# Patient Record
Sex: Female | Born: 1975 | Marital: Single | Smoking: Never smoker
Health system: Southern US, Community
[De-identification: ages and names within clinical notes are randomized; demographics above are authoritative.]

## PROBLEM LIST (undated history)

## (undated) DIAGNOSIS — D849 Immunodeficiency, unspecified: Secondary | ICD-10-CM

## (undated) DIAGNOSIS — I10 Essential (primary) hypertension: Secondary | ICD-10-CM

## (undated) DIAGNOSIS — R7303 Prediabetes: Secondary | ICD-10-CM

## (undated) DIAGNOSIS — F32A Depression, unspecified: Secondary | ICD-10-CM

## (undated) DIAGNOSIS — D509 Iron deficiency anemia, unspecified: Secondary | ICD-10-CM

## (undated) DIAGNOSIS — F419 Anxiety disorder, unspecified: Secondary | ICD-10-CM

## (undated) HISTORY — DX: Anxiety disorder, unspecified: F41.9

## (undated) HISTORY — DX: Prediabetes: R73.03

## (undated) HISTORY — DX: Iron deficiency anemia, unspecified: D50.9

## (undated) HISTORY — DX: Depression, unspecified: F32.A

---

## 2017-10-03 ENCOUNTER — Ambulatory Visit: Payer: Self-pay | Admitting: Podiatry

## 2017-10-17 ENCOUNTER — Ambulatory Visit: Payer: Medicaid Other | Admitting: Podiatry

## 2017-11-07 ENCOUNTER — Encounter: Payer: Self-pay | Admitting: Podiatry

## 2017-11-07 ENCOUNTER — Ambulatory Visit: Payer: Medicaid Other | Admitting: Podiatry

## 2017-11-07 DIAGNOSIS — L309 Dermatitis, unspecified: Secondary | ICD-10-CM

## 2017-11-07 DIAGNOSIS — L600 Ingrowing nail: Secondary | ICD-10-CM | POA: Diagnosis not present

## 2017-11-07 MED ORDER — HYDROCODONE-ACETAMINOPHEN 5-325 MG PO TABS: 1.0000 | ORAL_TABLET | Freq: Four times a day (QID) | ORAL | 0 refills | Status: DC | PRN

## 2017-11-07 NOTE — Patient Instructions (Addendum)

## 2017-11-07 NOTE — Progress Notes (Signed)
   Subjective:    Patient ID: Tiffany Rose, female    DOB: [birth year], 42 y.o.   MRN: [id number]  HPI    Review of Systems  All other systems reviewed and are negative.      Objective:   Physical Exam        Assessment & Plan:

## 2017-11-08 NOTE — Progress Notes (Signed)
Subjective:   Patient ID: Tiffany Rose, female   DOB: 42 y.o.   MRN: [id number]   HPI Patient presents stating she has dry skin and she has significant ingrown toenails of her big toes both feet that it is very sore and she is tried to trim them and soak it without relief and is making increasingly hard to wear shoe gear.  Patient does not smoke currently and likes to be active   Review of Systems  All other systems reviewed and are negative.       Objective:  Physical Exam  Constitutional: She appears well-developed and well-nourished.  Cardiovascular: Intact distal pulses.  Pulmonary/Chest: Effort normal.  Musculoskeletal: Normal range of motion.  Neurological: She is alert.  Skin: Skin is warm.  Nursing note and vitals reviewed.   Neurovascular status intact muscle strength was adequate range of motion within normal limits with patient found to have dry skin and heels bilateral with incurvated hallux nails bilateral medial border that are very painful when pressed with no drainage or redness noted.  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  She wants this done and I explained procedures and risk and she signed consent form understanding there is no guarantee as far as healing and complications as listed.  I infiltrated each hallux 60 mg like Marcaine mixture sterile prep applied to the toes and using sterile instrumentation I removed the medial border of the hallux bilateral exposed matrix and applied phenol 3 applications 30 seconds followed by alcohol lavage sterile dressing.  Gave instructions on soaks and reappoint and instructed what to do for dry skin and  reappoint to recheck

## 2018-07-31 ENCOUNTER — Other Ambulatory Visit: Payer: Self-pay | Admitting: Physician Assistant

## 2018-07-31 DIAGNOSIS — R1084 Generalized abdominal pain: Secondary | ICD-10-CM

## 2018-07-31 DIAGNOSIS — R110 Nausea: Secondary | ICD-10-CM

## 2018-08-06 ENCOUNTER — Ambulatory Visit
Admission: RE | Admit: 2018-08-06 | Discharge: 2018-08-06 | Disposition: A | Discharge status: Home / Self Care | Payer: Medicaid Other | Source: Ambulatory Visit | Attending: Physician Assistant | Admitting: Physician Assistant

## 2018-08-06 DIAGNOSIS — R1084 Generalized abdominal pain: Secondary | ICD-10-CM

## 2018-08-06 DIAGNOSIS — R110 Nausea: Secondary | ICD-10-CM

## 2018-08-11 ENCOUNTER — Emergency Department (HOSPITAL_COMMUNITY): Payer: Medicaid Other

## 2018-08-11 ENCOUNTER — Emergency Department (HOSPITAL_COMMUNITY)
Admission: EM | Admit: 2018-08-11 | Discharge: 2018-08-11 | Disposition: A | Discharge status: Home / Self Care | Payer: Medicaid Other | Attending: Emergency Medicine | Admitting: Emergency Medicine

## 2018-08-11 ENCOUNTER — Other Ambulatory Visit: Payer: Self-pay

## 2018-08-11 ENCOUNTER — Encounter (HOSPITAL_COMMUNITY): Payer: Self-pay | Admitting: Emergency Medicine

## 2018-08-11 DIAGNOSIS — Z79899 Other long term (current) drug therapy: Secondary | ICD-10-CM | POA: Insufficient documentation

## 2018-08-11 DIAGNOSIS — H4710 Unspecified papilledema: Secondary | ICD-10-CM | POA: Insufficient documentation

## 2018-08-11 DIAGNOSIS — I10 Essential (primary) hypertension: Secondary | ICD-10-CM | POA: Diagnosis not present

## 2018-08-11 DIAGNOSIS — H538 Other visual disturbances: Secondary | ICD-10-CM | POA: Diagnosis present

## 2018-08-11 HISTORY — DX: Essential (primary) hypertension: I10

## 2018-08-11 HISTORY — DX: Immunodeficiency, unspecified: D84.9

## 2018-08-11 LAB — CBC WITH DIFFERENTIAL/PLATELET
Abs Immature Granulocytes: 0.01 10*3/uL (ref 0.00–0.07)
Basophils Absolute: 0 10*3/uL (ref 0.0–0.1)
Basophils Relative: 1 %
EOS ABS: 0.1 10*3/uL (ref 0.0–0.5)
EOS PCT: 1 %
HEMATOCRIT: 35.7 % — AB (ref 36.0–46.0)
Hemoglobin: 10.8 g/dL — ABNORMAL LOW (ref 12.0–15.0)
Immature Granulocytes: 0 %
LYMPHS ABS: 1.7 10*3/uL (ref 0.7–4.0)
Lymphocytes Relative: 38 %
MCH: 21.2 pg — AB (ref 26.0–34.0)
MCHC: 30.3 g/dL (ref 30.0–36.0)
MCV: 70 fL — AB (ref 80.0–100.0)
MONO ABS: 0.2 10*3/uL (ref 0.1–1.0)
MONOS PCT: 5 %
Neutro Abs: 2.4 10*3/uL (ref 1.7–7.7)
Neutrophils Relative %: 55 %
Platelets: 411 10*3/uL — ABNORMAL HIGH (ref 150–400)
RBC: 5.1 MIL/uL (ref 3.87–5.11)
RDW: 16.2 % — AB (ref 11.5–15.5)
WBC: 4.4 10*3/uL (ref 4.0–10.5)
nRBC: 0 % (ref 0.0–0.2)

## 2018-08-11 LAB — BASIC METABOLIC PANEL
Anion gap: 15 (ref 5–15)
BUN: 15 mg/dL (ref 6–20)
CO2: 24 mmol/L (ref 22–32)
CREATININE: 0.92 mg/dL (ref 0.44–1.00)
Calcium: 10 mg/dL (ref 8.9–10.3)
Chloride: 98 mmol/L (ref 98–111)
GFR calc Af Amer: >60 mL/min (ref >60)
GFR calc non Af Amer: >60 mL/min (ref >60)
GLUCOSE: 102 mg/dL — AB (ref 70–99)
Potassium: 3.6 mmol/L (ref 3.5–5.1)
Sodium: 137 mmol/L (ref 135–145)

## 2018-08-11 MED ORDER — LORAZEPAM 1 MG PO TABS
1.0000 mg | ORAL_TABLET | Freq: Once | ORAL | Status: AC
  Administered 2018-08-11: 1 mg via ORAL
  Filled 2018-08-11: qty 1

## 2018-08-11 NOTE — Discharge Instructions (Signed)
Please return for any problem.  Follow-up with your regular care provider as instructed.  Your MRI does not show evidence of significant intracranial pathology such as a brain tumor.  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  This requires close follow-up with neurology.  You will need further work-up as an outpatient.  If you develop worsening headache or significant visual change please return to the ED immediately.

## 2018-08-11 NOTE — ED Triage Notes (Addendum)
Pt reports going to the ophthalmologist for  blurred vision in the Left eye. 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 Pt tearful at triage. She reports waiting to come due to lack of child care. Swarm in progress.

## 2018-08-11 NOTE — ED Notes (Signed)
Unable to get blood from IV. Phlebotomist notified.

## 2018-08-11 NOTE — ED Notes (Signed)
Patient verbalizes understanding of discharge instructions. Opportunity for questioning and answers were provided. 

## 2018-08-11 NOTE — ED Notes (Signed)
Pt back from MRI 

## 2018-08-11 NOTE — ED Notes (Signed)
Social work contacted for assistance with the patients children while she is in a long exam.

## 2018-08-11 NOTE — ED Provider Notes (Signed)
[facility] EMERGENCY DEPARTMENT Provider Note   CSN: [id number] Arrival date & time: 08/11/18  1059     History   Chief Complaint Chief Complaint  Patient presents with  . Blurred Vision    HPI Berea Barsanti is a 42 y.o. female.  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  Patient was seen by her optometrist earlier this week.  She reports that at that time she was diagnosed with bilateral papilledema.  She was told to come to the ED for MRI / MRV imaging.  Her exam was performed on Thursday.  She presents today with her children.  She was unable to find childcare prior to today in order to come to the ED sooner.  She denies headache.  She denies significant change in her vision over the last 1 to 2 weeks.  She reports ongoing mildly blurry vision that is been a problem for the last several months.  The history is provided by the patient and medical records.  Illness  This is a new problem. The current episode started more than 1 week ago. The problem occurs constantly. The problem has not changed since onset.Pertinent negatives include no chest pain, no abdominal pain, no headaches and no shortness of breath. Nothing aggravates the symptoms. Nothing relieves the symptoms.    Past Medical History:  Diagnosis Date  . Hypertension   . Immune deficiency disorder (HCC)     There are no active problems to display for this patient.   History reviewed. No pertinent surgical history.   OB History   No obstetric history on file.      Home Medications    Prior to Admission medications   Medication Sig Start Date End Date Taking? Authorizing Provider  HYDROcodone-acetaminophen (NORCO/VICODIN) 5-325 MG tablet Take 1 tablet by mouth every 6 (six) hours as needed for moderate pain. 11/07/17   Lenn Sink, DPM    Family History No family history on file.  Social History Social History    Tobacco Use  . Smoking status: Never Smoker  . Smokeless tobacco: Never Used  Substance Use Topics  . Alcohol use: Yes  . Drug use: Yes    Types: Marijuana     Allergies   Patient has no known allergies.   Review of Systems Review of Systems  Respiratory: Negative for shortness of breath.   Cardiovascular: Negative for chest pain.  Gastrointestinal: Negative for abdominal pain.  Neurological: Negative for headaches.  All other systems reviewed and are negative.    Physical Exam Updated Vital Signs BP 123/85   Pulse (!) 101   Temp 98.6 F (37 C) (Oral)   Resp 20   Ht 5\' 4"  (1.626 m)   Wt 131.5 kg   LMP 07/25/2018 (Exact Date)   SpO2 99%   BMI 49.78 kg/m   Physical Exam Vitals signs and nursing note reviewed.  Constitutional:      General: She is not in acute distress.    Appearance: She is well-developed.  HENT:     Head: Normocephalic and atraumatic.  Eyes:     Conjunctiva/sclera: Conjunctivae normal.     Pupils: Pupils are equal, round, and reactive to light.  Neck:     Musculoskeletal: Normal range of motion and neck supple.  Cardiovascular:     Rate and Rhythm: Normal rate and regular rhythm.     Heart sounds: Normal heart sounds.  Pulmonary:  Effort: Pulmonary effort is normal. No respiratory distress.     Breath sounds: Normal breath sounds.  Abdominal:     General: There is no distension.     Palpations: Abdomen is soft.     Tenderness: There is no abdominal tenderness.  Musculoskeletal: Normal range of motion.        General: No deformity.  Skin:    General: Skin is warm and dry.  Neurological:     General: No focal deficit present.     Mental Status: She is alert and oriented to person, place, and time. Mental status is at baseline.     Cranial Nerves: No cranial nerve deficit.     Sensory: No sensory deficit.     Motor: No weakness.     Coordination: Coordination normal.     Gait: Gait normal.     Deep Tendon Reflexes: Reflexes  normal.      ED Treatments / Results  Labs (all labs ordered are listed, but only abnormal results are displayed) Labs Reviewed  BASIC METABOLIC PANEL - Abnormal; Notable for the following components:      Result Value   Glucose, Bld 102 (*)    All other components within normal limits  CBC WITH DIFFERENTIAL/PLATELET - Abnormal; Notable for the following components:   Hemoglobin 10.8 (*)    HCT 35.7 (*)    MCV 70.0 (*)    MCH 21.2 (*)    RDW 16.2 (*)    Platelets 411 (*)    All other components within normal limits    EKG None  Radiology Mr Brain Wo Contrast  Result Date: 08/11/2018 CLINICAL DATA:  Papilledema. Suspected idiopathic intracranial hypertension. EXAM: MRI HEAD AND ORBITS WITHOUT CONTRAST TECHNIQUE: Multiplanar, multiecho pulse sequences of the brain and surrounding structures were obtained without intravenous contrast. Multiplanar, multiecho pulse sequences of the orbits and surrounding structures were obtained including fat saturation techniques, without intravenous contrast administration. COMPARISON:  MRV reported separately. FINDINGS: The patient was unable to remain motionless for the exam. Small or subtle lesions could be overlooked. MRI HEAD FINDINGS Brain: No evidence for acute infarction, hemorrhage, mass lesion, hydrocephalus, or extra-axial fluid. Normal cerebral volume. Prominent perivascular spaces, but no definite white matter disease. Mild tonsillar ectopia. Marked empty sella, with expansion. Vascular: Flow voids are maintained. Skull and upper cervical spine: Normal marrow signal Other: None MRI ORBITS FINDINGS Orbits: No traumatic or inflammatory finding. There is flattening of the optic nerve insertion into the globes, consistent with the clinical impression of papilledema. There is prominence of the perioptic CSF, representing dilated optic nerve sheathes. The orbital fat, extraocular muscles, vascular structures, and lacrimal glands are normal.  Visualized sinuses: Clear. Soft tissues: Increased body habitus. Limited intracranial: Reported separately. IMPRESSION: Motion degraded exam.  Small or subtle lesions could be overlooked. No acute intracranial or orbital abnormality. 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 Electronically Signed   By: Elsie StainJohn T Curnes M.D.   On: 08/11/2018 14:18   Mr Mrv Head Wo Cm  Result Date: 08/11/2018 CLINICAL DATA:  Papilledema. Suspected dural venous sinus stenosis or thrombosis. EXAM: MR VENOGRAM of the HEAD WITHOUT CONTRAST TECHNIQUE: Angiographic images of the intracranial venous structures were obtained using MRV technique without intravenous contrast. COMPARISON:  None. FINDINGS: The superior sagittal sinus is patent. The internal cerebral veins are patent. The straight sinus is patent. There is no visible cortical venous thrombosis.  Both transverse sinuses are patent, but there is narrowing at the junction of the transverse and sigmoid sinuses, concerning for venous sinus stenosis. Sigmoid sinuses are widely patent. Both internal cerebral veins are widely patent. 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 In the appropriate setting, findings could be contributory to idiopathic intracranial hypertension. Electronically Signed   By: Elsie Stain M.D.   On: 08/11/2018 14:11   Mr Birdie Hopes IO Contrast  Result Date: 08/11/2018 CLINICAL DATA:  Papilledema. Suspected idiopathic intracranial hypertension. EXAM: MRI HEAD AND ORBITS WITHOUT CONTRAST TECHNIQUE: Multiplanar, multiecho pulse sequences of the brain and surrounding structures were obtained without intravenous contrast. Multiplanar, multiecho pulse sequences of the orbits and surrounding structures were obtained including fat saturation techniques,  without intravenous contrast administration. COMPARISON:  MRV reported separately. FINDINGS: The patient was unable to remain motionless for the exam. Small or subtle lesions could be overlooked. MRI HEAD FINDINGS Brain: No evidence for acute infarction, hemorrhage, mass lesion, hydrocephalus, or extra-axial fluid. Normal cerebral volume. Prominent perivascular spaces, but no definite white matter disease. Mild tonsillar ectopia. Marked empty sella, with expansion. Vascular: Flow voids are maintained. Skull and upper cervical spine: Normal marrow signal Other: None MRI ORBITS FINDINGS Orbits: No traumatic or inflammatory finding. There is flattening of the optic nerve insertion into the globes, consistent with the clinical impression of papilledema. There is prominence of the perioptic CSF, representing dilated optic nerve sheathes. The orbital fat, extraocular muscles, vascular structures, and lacrimal glands are normal. Visualized sinuses: Clear. Soft tissues: Increased body habitus. Limited intracranial: Reported separately. IMPRESSION: Motion degraded exam.  Small or subtle lesions could be overlooked. No acute intracranial or orbital abnormality. 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 Electronically Signed   By: Elsie Stain M.D.   On: 08/11/2018 14:18    Procedures Procedures (including critical care time)  Medications Ordered in ED Medications  LORazepam (ATIVAN) tablet 1 mg (1 mg Oral Given 08/11/18 1221)     Initial Impression / Assessment and Plan / ED Course  I have reviewed the triage vital signs and the nursing notes.  Pertinent labs & imaging results that were available during my care of the patient were reviewed by me and considered in my medical decision making (see chart for details).     MDM  Screen complete  Patient is  presenting for evaluation of bilateral papilledema found on work-up by her optometrist.  Patient was instructed to present to the ED for MRI imaging 3 days prior.  She presents today with her children.  MRI / MRV demonstrates findings consistent with likely idiopathic intracranial hypertension.  Patient is aware of these findings.  She understands the need for close follow-up with neurology.  Case discussed with in-house neuro hospitalist Dr. Otelia Limes.  He agrees with plan of care.  She understands that she will need to see neurology and likely undergo an LP.  She understands she will need to have childcare for these procedures.  Strict return precautions given and understood.  Importance of close follow-up is repeatedly stressed.  Final Clinical Impressions(s) / ED Diagnoses   Final diagnoses:  Blurred vision    ED Discharge Orders    None       Wynetta Fines, MD 08/11/18 1547

## 2018-08-11 NOTE — ED Notes (Signed)
Patient transported to MRI 

## 2018-09-02 ENCOUNTER — Other Ambulatory Visit: Payer: Self-pay | Admitting: Radiology

## 2019-05-29 IMAGING — US US ABDOMEN COMPLETE
1 series · 14 of 25 positions shown · non-contrast
Comparison: None.

CLINICAL DATA: Abdominal pain with nausea

EXAM:
ABDOMEN ULTRASOUND COMPLETE

[Series 1: us abdomen complete · 0.15mm/px · 14 of 76 slices shown]
[im 1/76]
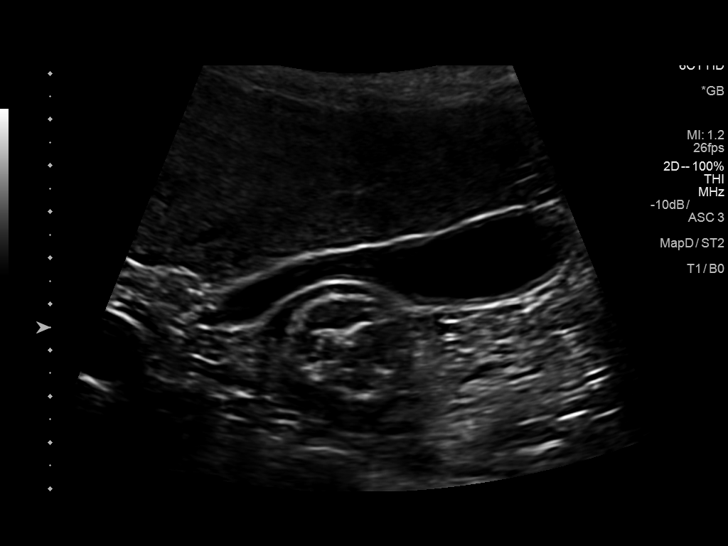
[im 7/76]
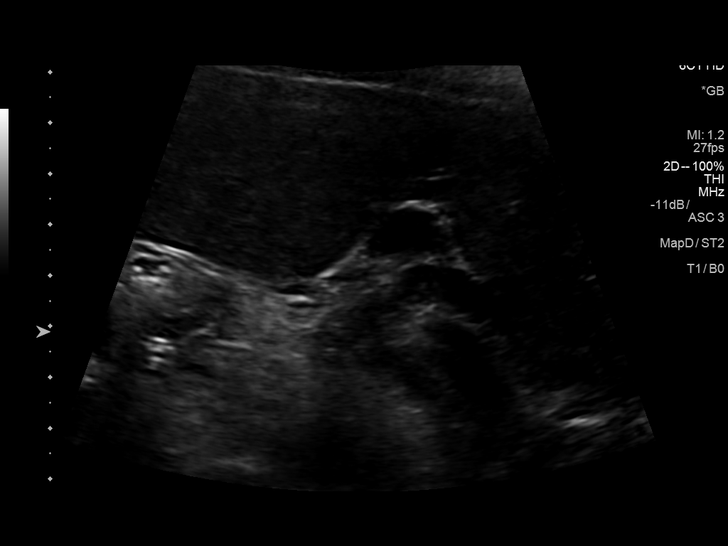
[im 13/76]
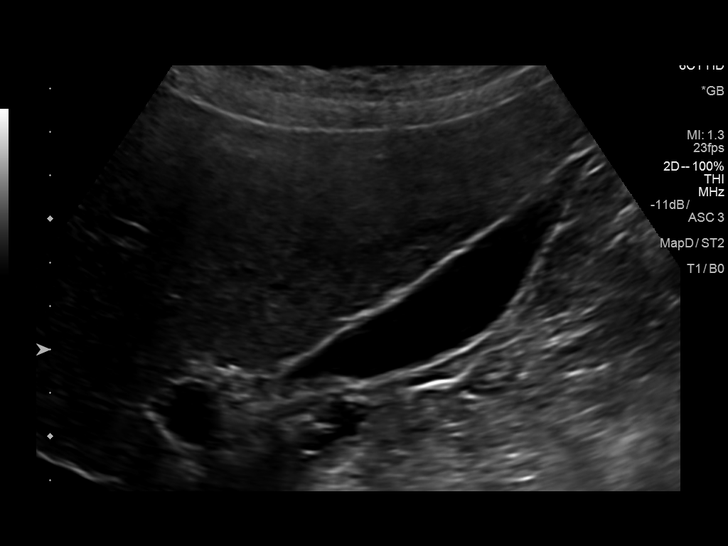
[im 19/76]
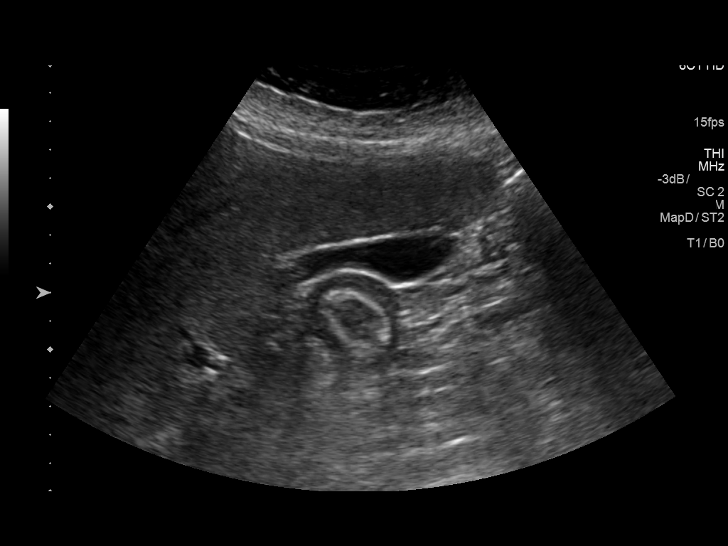
[im 26/76]
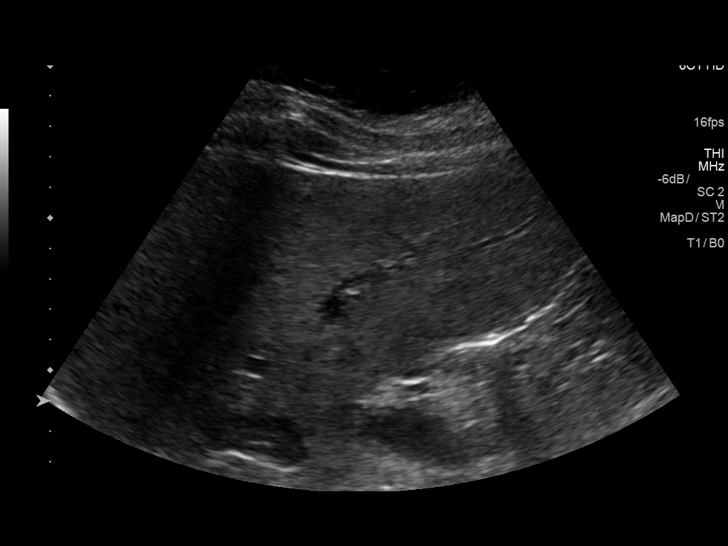
[im 29/76]
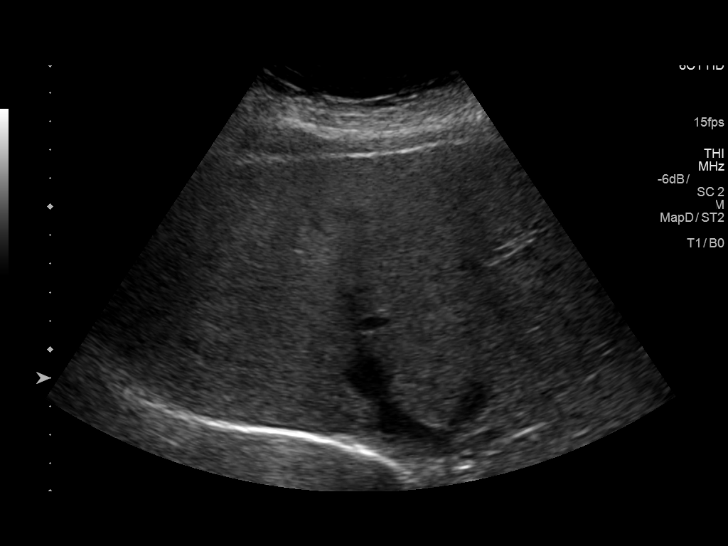
[im 35/76]
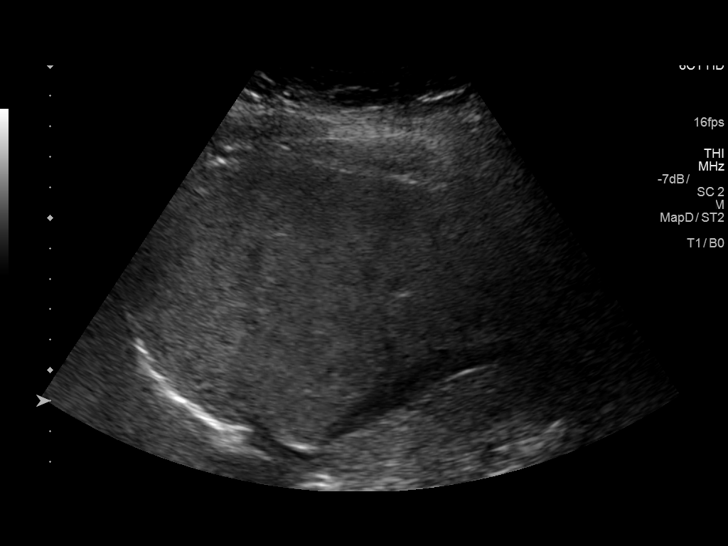
[im 41/76]
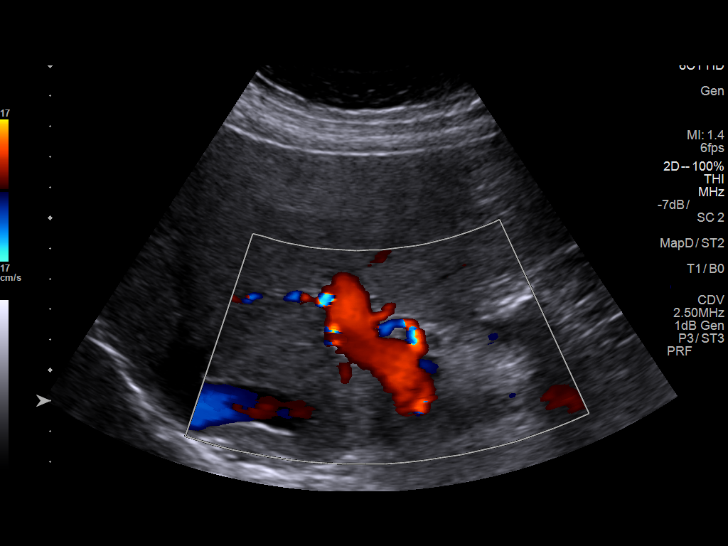
[im 47/76]
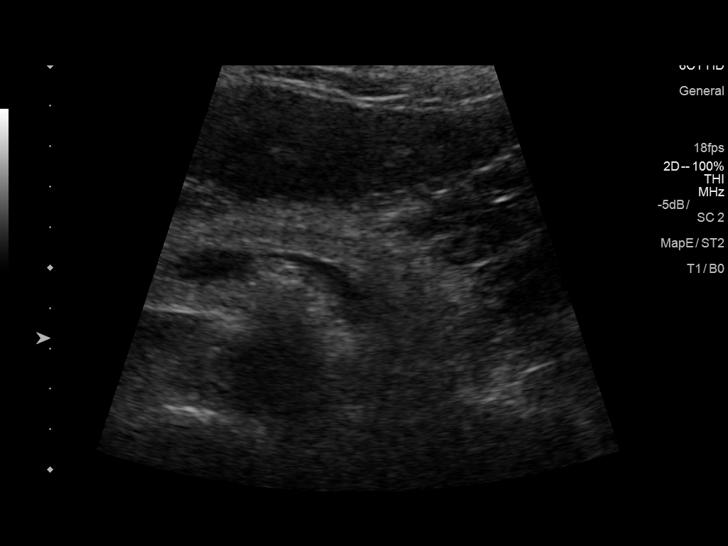
[im 51/76]
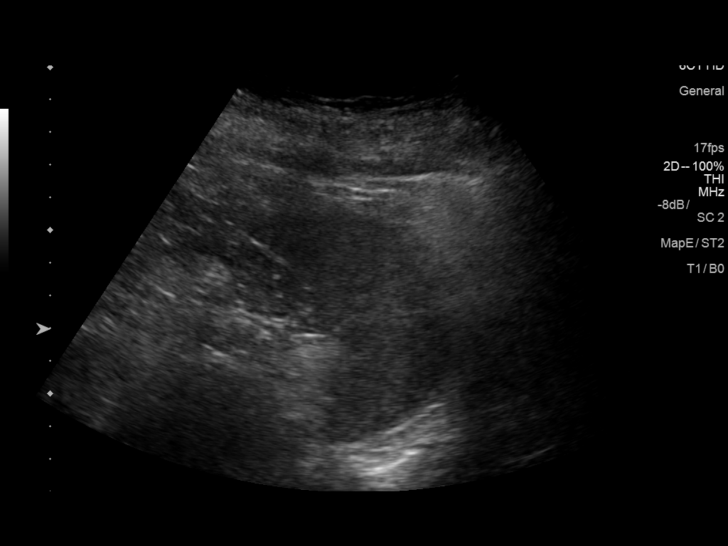
[im 57/76]
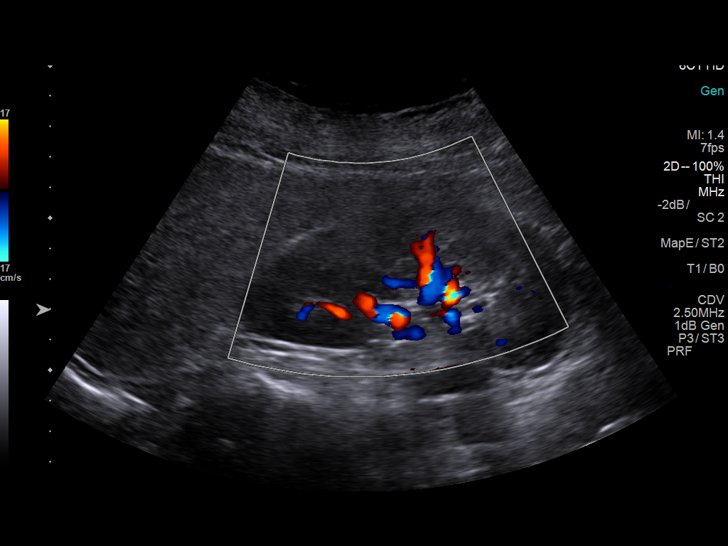
[im 63/76]
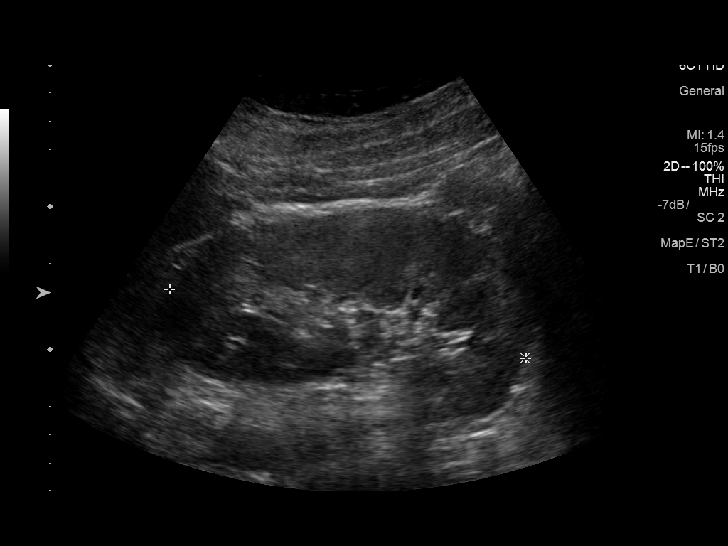
[im 69/76]
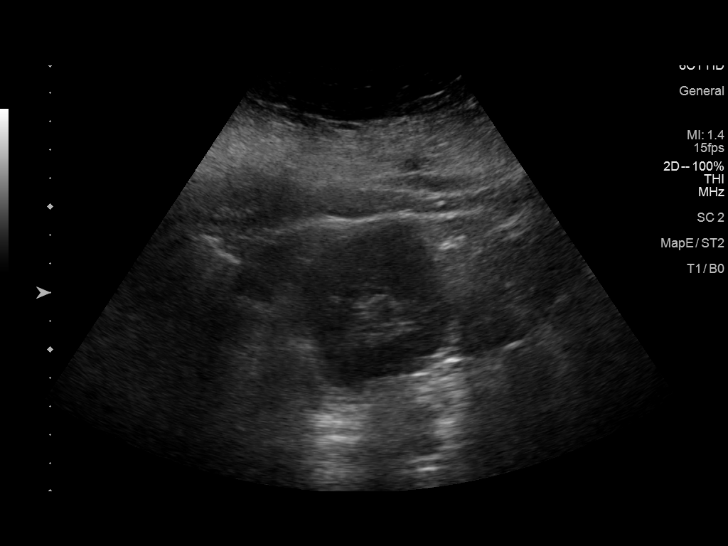
[im 76/76]
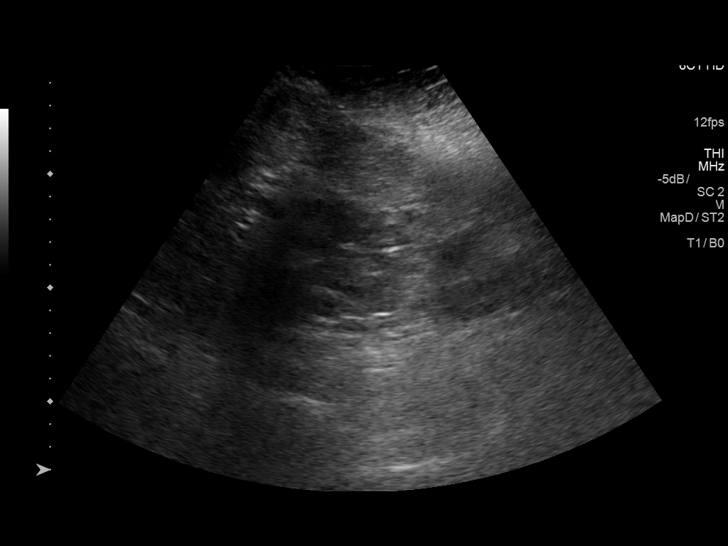

[14 of 25 positions shown; findings below may reference images not displayed]

FINDINGS: Gallbladder: No gallstones or wall thickening visualized. No
sonographic Murphy sign noted by sonographer.

Common bile duct: Diameter: 2.3 mm

Liver: Increased hepatic echogenicity without focal hepatic
abnormality. Heterogeneous echotexture. Portal vein is patent on
color Doppler imaging with normal direction of blood flow towards
the liver.

IVC: Poorly visualized due to bowel gas.

Pancreas: Visualized portion unremarkable.

Spleen: Size and appearance within normal limits.

Right Kidney: Length: 13.2 cm. Echogenicity within normal limits. No
mass or hydronephrosis visualized.

Left Kidney: Length: 13.3 cm. Echogenicity within normal limits. No
mass or hydronephrosis visualized.

Abdominal aorta: Obscured by bowel gas.

Other findings: None.
IMPRESSION: 1. Negative for gallstones or biliary dilatation
2. Increased hepatic echogenicity suggesting steatosis

## 2020-08-16 ENCOUNTER — Other Ambulatory Visit: Payer: Self-pay | Admitting: Student

## 2020-08-16 DIAGNOSIS — N611 Abscess of the breast and nipple: Secondary | ICD-10-CM

## 2020-12-28 ENCOUNTER — Other Ambulatory Visit: Payer: Self-pay

## 2020-12-28 ENCOUNTER — Ambulatory Visit: Payer: Medicaid Other | Admitting: Cardiology

## 2020-12-28 ENCOUNTER — Encounter: Payer: Self-pay | Admitting: Cardiology

## 2020-12-28 VITALS — BP 123/85 | HR 89 | Temp 98.0°F | Resp 16 | Ht 63.0 in | Wt 290.0 lb

## 2020-12-28 DIAGNOSIS — Z8249 Family history of ischemic heart disease and other diseases of the circulatory system: Secondary | ICD-10-CM

## 2020-12-28 DIAGNOSIS — R002 Palpitations: Secondary | ICD-10-CM

## 2020-12-28 DIAGNOSIS — I10 Essential (primary) hypertension: Secondary | ICD-10-CM

## 2020-12-28 DIAGNOSIS — Z87891 Personal history of nicotine dependence: Secondary | ICD-10-CM

## 2020-12-28 DIAGNOSIS — R7303 Prediabetes: Secondary | ICD-10-CM

## 2020-12-28 DIAGNOSIS — R0602 Shortness of breath: Secondary | ICD-10-CM

## 2020-12-28 DIAGNOSIS — F1290 Cannabis use, unspecified, uncomplicated: Secondary | ICD-10-CM

## 2020-12-28 DIAGNOSIS — R072 Precordial pain: Secondary | ICD-10-CM

## 2020-12-28 DIAGNOSIS — Z6841 Body Mass Index (BMI) 40.0 and over, adult: Secondary | ICD-10-CM

## 2020-12-28 NOTE — Progress Notes (Signed)
Date:  12/28/2020   ID:  Tiffany Rose, DOB 1975-10-11, MRN [id number]  PCP:  Trey Sailors, PA  Cardiologist:  Rex Kras, DO, [facility] (established care 12/28/2020)  REASON FOR CONSULT: Palpitations  REQUESTING PHYSICIAN:  Trey Sailors, PA [address] [place],  [place] [address]  Chief Complaint  Patient presents with  . Palpitations  . New Patient (Initial Visit)  . Chest Pain    HPI  Tiffany Rose is a 45 y.o. female who presents to the office with a chief complaint of "palpitations and chest pain." Patient's past medical history and cardiovascular risk factors include: former smoker, benign essential hypertension, hyperlipidemia, vitamin D deficiency, anxiety/depression, anemia, obesity due to excess calories, family history of congestive heart failure and premature CAD.  She is referred to the office at the request of Trey Sailors, [place] for evaluation of palpitations.  Palpitations: Patient states that she has been experiencing palpitations since 2020.  However, recently has noticed increase in intensity, frequency, and duration over the last 5 weeks.  Patient states that recently she was in the car in the passenger seat when she had sudden onset of palpitations lasting for about 90 seconds and self-limited.  Symptoms of palpitations occur at least once or twice a week, short-lived, less than 1 minute in duration, self-limited, no worsening factors, improves with resting and deep breathing exercises.  Patient denies any near-syncope or syncopal events.  She consumes 1 cup of 12 ounce coffee on a daily basis.  2 cans of soda per day, and a glass of wine 4 days of the week.  Patient is currently being treated for iron deficiency anemia as well.  She refuses the use of tobacco, thyroid disease, alcohol consumption, weight loss supplements, stimulant drugs.  Chest pain: Patient stated that the symptoms started first back in 2020 and would like to have them  addressed as well.  The symptoms are not increasing in intensity, frequency, duration.  Patient states that his chest pain is substernally located, intensity 7 out of 10, squeezing-like sensation and at times pressure.  Patient states that " feels like an elephant is on the chest."  Duration less than a minute.  Not brought on by effort related activities and does not resolve with resting.  The symptoms are usually self-limited.  Patient has started working out at the gym about a week ago and tries to walk on a treadmill about 4 miles an hour for about 60 minutes.  She has not noticed any significant change in physical endurance.  Patient states that she had a sleep study in the past which was negative for sleep apnea.  Family history of congestive heart failure: Mom (dx at age of 1 and died at age of 41), Brother (dx in his mid 77s and died at age of 56 - patient describes him "wearning a backpack" ?LVAD and strong hx of polysubstance use), Niece (had CAD and MI/PCI in mid 36s and died at age of 42). Two brothers are alive one at age 64 and another at age 2.   No family history of sudden cardiac death.   FUNCTIONAL STATUS: Started going back to the gym about a week ago walks on a treadmill at 4 miles an hour for about 60 minutes each.  ALLERGIES: No Known Allergies  MEDICATION LIST PRIOR TO VISIT: Current Meds  Medication Sig  . gabapentin (NEURONTIN) 300 MG capsule Take 1 capsule by mouth 2 (two) times daily.  . hydrochlorothiazide (HYDRODIURIL) 25  MG tablet Take 1 tablet by mouth daily at 6 (six) AM.  . LORazepam (ATIVAN) 0.5 MG tablet Take 0.5 mg by mouth 2 (two) times daily as needed.  . meloxicam (MOBIC) 15 MG tablet Take 1 tablet by mouth daily.  . propranolol (INDERAL) 10 MG tablet Take 10 mg by mouth 2 (two) times daily as needed.     PAST MEDICAL HISTORY: Past Medical History:  Diagnosis Date  . Anxiety   . Depression   . Hypertension   . Immune deficiency disorder ([place])    . Iron (Fe) deficiency anemia   . Prediabetes     PAST SURGICAL HISTORY: History reviewed. No pertinent surgical history.  FAMILY HISTORY: The patient family history includes Congestive Heart Failure (age of onset: 25) in her brother; Heart disease in her brother; Hypertension in her mother.  SOCIAL HISTORY:  The patient  reports that she has never smoked. She has never used smokeless tobacco. She reports current alcohol use. She reports current drug use. Drug: Marijuana.  REVIEW OF SYSTEMS: Review of Systems  Constitutional: Negative for chills and fever.  HENT: Negative for hoarse voice and nosebleeds.   Eyes: Negative for discharge, double vision and pain.  Cardiovascular: Positive for chest pain, dyspnea on exertion and palpitations. Negative for claudication, leg swelling, near-syncope, orthopnea, paroxysmal nocturnal dyspnea and syncope.  Respiratory: Negative for hemoptysis and shortness of breath.   Musculoskeletal: Negative for muscle cramps and myalgias.  Gastrointestinal: Negative for abdominal pain, constipation, diarrhea, hematemesis, hematochezia, melena, nausea and vomiting.  Neurological: Negative for dizziness and light-headedness.    PHYSICAL EXAM: Vitals with BMI 12/28/2020 08/11/2018 08/11/2018  Height $Remov'5\' 3"'FsCLAJ$  - -  Weight 290 lbs - -  BMI 74.94 - -  Systolic 496 759 163  Diastolic 85 84 94  Pulse 89 94 100    CONSTITUTIONAL: Well-developed and well-nourished. No acute distress.  SKIN: Skin is warm and dry. No rash noted. No cyanosis. No pallor. No jaundice HEAD: Normocephalic and atraumatic.  EYES: No scleral icterus MOUTH/THROAT: Moist oral membranes.  NECK: No JVD present. No thyromegaly noted. No carotid bruits  LYMPHATIC: No visible cervical adenopathy.  CHEST Normal respiratory effort. No intercostal retractions  LUNGS: Clear to auscultation bilaterally. No stridor. No wheezes. No rales.  CARDIOVASCULAR: Regular rate and rhythm, positive S1-S2, no  murmurs rubs or gallops appreciated. ABDOMINAL: Obese, soft, nontender, distended, positive bowel sounds in all 4 quadrants. No apparent ascites.  EXTREMITIES: No peripheral edema  HEMATOLOGIC: No significant bruising NEUROLOGIC: Oriented to person, place, and time. Nonfocal. Normal muscle tone.  PSYCHIATRIC: Normal mood and affect. Normal behavior. Cooperative  CARDIAC DATABASE: EKG: 12/28/2020: Normal sinus rhythm, 90 bpm, normal axis, without underlying ischemia or injury pattern.   Echocardiogram: No results found for this or any previous visit from the past 1095 days.   Stress Testing: No results found for this or any previous visit from the past 1095 days.  Heart Catheterization: None  LABORATORY DATA: CBC Latest Ref Rng & Units 08/11/2018  WBC 4.0 - 10.5 K/uL 4.4  Hemoglobin 12.0 - 15.0 g/dL 10.8(L)  Hematocrit 36.0 - 46.0 % 35.7(L)  Platelets 150 - 400 K/uL 411(H)    CMP Latest Ref Rng & Units 08/11/2018  Glucose 70 - 99 mg/dL 102(H)  BUN 6 - 20 mg/dL 15  Creatinine 0.44 - 1.00 mg/dL 0.92  Sodium 135 - 145 mmol/L 137  Potassium 3.5 - 5.1 mmol/L 3.6  Chloride 98 - 111 mmol/L 98  CO2 22 - 32  mmol/L 24  Calcium 8.9 - 10.3 mg/dL 10.0    Lipid Panel  No results found for: CHOL, TRIG, HDL, CHOLHDL, VLDL, LDLCALC, LDLDIRECT, LABVLDL  No components found for: NTPROBNP No results for input(s): PROBNP in the last 8760 hours. No results for input(s): TSH in the last 8760 hours.  BMP No results for input(s): NA, K, CL, CO2, GLUCOSE, BUN, CREATININE, CALCIUM, GFRNONAA, GFRAA in the last 8760 hours.  HEMOGLOBIN A1C No results found for: HGBA1C, MPG  External Labs: Collected: 11/24/2020 provided by primary care Creatinine 0.17 mg/dL. eGFR: >60 mL/min per 1.73 m Sodium 138, potassium 4.3, chloride 103, bicarb 26 Hemoglobin A1c: 6.3 TSH: 1.26   IMPRESSION:    ICD-10-CM   1. Palpitations  R00.2 EKG 12-Lead    LONG TERM MONITOR (3-14 DAYS)  2. Precordial pain   R07.2 PCV ECHOCARDIOGRAM COMPLETE    PCV CARDIAC STRESS TEST    CT CARDIAC SCORING (SELF PAY ONLY)    SARS-COV-2 RNA,(COVID-19) QUAL NAAT  3. Shortness of breath  R06.02   4. Benign hypertension  I10   5. Prediabetes  R73.03   6. Class 3 severe obesity due to excess calories without serious comorbidity with body mass index (BMI) of 50.0 to 59.9 in adult (HCC)  E66.01    Z68.43   7. Family history of CHF (congestive heart failure)  Z82.49   8. Family history of premature CAD  Z82.49 CT CARDIAC SCORING (SELF PAY ONLY)    Pregnancy, urine  9. Former smoker  Z87.891   33. Marijuana use  F12.90      RECOMMENDATIONS: Tiffany Rose is a 45 y.o. female whose past medical history and cardiac risk factors include: former smoker, benign essential hypertension, hyperlipidemia, vitamin D deficiency, anxiety/depression, anemia, obesity due to excess calories, family history of congestive heart failure and premature CAD.  Palpitations:  No identifiable reversible causes.  Outside labs independently reviewed.  Patient does have history of iron deficiency anemia currently being treated by her PCP.  TSH within normal limits.  Patient was prescribed propanolol by her PCP and takes it on a as needed basis.  Patient states that the symptoms do improve several hours after taking propranolol.  Recommended 14-day extended Holter monitor to evaluate for underlying dysrhythmias.  Precordial pain:  Most likely noncardiac etiology.    However given her multiple cardiovascular risk factors including family history of premature CHF/CAD.  Echocardiogram will be ordered to evaluate for structural heart disease and left ventricular systolic function.  Plan exercise treadmill stress test.  Coronary artery calcium scoring for further risk stratification.  Benign essential hypertension:  Office blood pressure is very well controlled.  Medications reconciled.  Low-salt diet recommended  Currently  managed by primary care provider.  Obesity, due to excess calories: . Body mass index is 51.37 kg/m. . I reviewed with the patient the importance of diet, regular physical activity/exercise, weight loss.   . Patient is educated on increasing physical activity gradually as tolerated.  With the goal of moderate intensity exercise for 30 minutes a day 5 days a week.  Patient has a family history of premature CAD and congestive heart failure.  Had a lengthy discussion with her with regards to improving her modifiable cardiovascular risk factors including optimizing her blood pressure management, making sure she does not become a diabetic and improving/monitoring her glycemic index, increasing physical activity to 30 minutes a day 5 days a week as tolerated to facilitate weight loss, vaccination/social distancing with regards to COVID-19 pandemic.  FINAL MEDICATION LIST END OF ENCOUNTER: No orders of the defined types were placed in this encounter.   Medications Discontinued During This Encounter  Medication Reason  . HYDROcodone-acetaminophen (NORCO/VICODIN) 5-325 MG tablet Error     Current Outpatient Medications:  .  gabapentin (NEURONTIN) 300 MG capsule, Take 1 capsule by mouth 2 (two) times daily., Disp: , Rfl:  .  hydrochlorothiazide (HYDRODIURIL) 25 MG tablet, Take 1 tablet by mouth daily at 6 (six) AM., Disp: , Rfl:  .  LORazepam (ATIVAN) 0.5 MG tablet, Take 0.5 mg by mouth 2 (two) times daily as needed., Disp: , Rfl:  .  meloxicam (MOBIC) 15 MG tablet, Take 1 tablet by mouth daily., Disp: , Rfl:  .  propranolol (INDERAL) 10 MG tablet, Take 10 mg by mouth 2 (two) times daily as needed., Disp: , Rfl:   Orders Placed This Encounter  Procedures  . SARS-COV-2 RNA,(COVID-19) QUAL NAAT  . CT CARDIAC SCORING (SELF PAY ONLY)  . Pregnancy, urine  . PCV CARDIAC STRESS TEST  . LONG TERM MONITOR (3-14 DAYS)  . EKG 12-Lead  . PCV ECHOCARDIOGRAM COMPLETE    There are no Patient Instructions  on file for this visit.   --Continue cardiac medications as reconciled in final medication list. --Return in about 6 weeks (around 02/08/2021) for Follow up CP, palpitation, review test. . Or sooner if needed. --Continue follow-up with your primary care physician regarding the management of your other chronic comorbid conditions.  Patient's questions and concerns were addressed to her satisfaction. She voices understanding of the instructions provided during this encounter.   This note was created using a voice recognition software as a result there may be grammatical errors inadvertently enclosed that do not reflect the nature of this encounter. Every attempt is made to correct such errors.  Rex Kras, [place], [facility]  Pager: [phone] Office: [phone]

## 2020-12-28 NOTE — Progress Notes (Deleted)
Date:  12/28/2020   ID:  Tiffany Rose, DOB Aug 09, 1976, MRN [id number]  PCP:  Trey Sailors, PA  Cardiologist:  Rex Kras, DO, [facility] (established care 12/28/2020) Former Cardiology Providers: ***  REASON FOR CONSULT: Palpitations  REQUESTING PHYSICIAN:  Trey Sailors, [place],  [place] [address]  Chief Complaint  Patient presents with  . Palpitations  . Hypertension  . New Patient (Initial Visit)    HPI  Tiffany Rose is a 45 y.o. female who presents to the office with a chief complaint of "palpitations." Patient's past medical history and cardiovascular risk factors include: former smoker, benign essential hypertension, hyperlipidemia, vitamin D deficiency, anxiety/depression, anemia, obesity due to excess calories, ***  She is referred to the office at the request of Trey Sailors, [place] for evaluation of ***.  ***  History of  Denies prior history of coronary artery disease, myocardial infarction, congestive heart failure, deep venous thrombosis, pulmonary embolism, stroke, transient ischemic attack.  FUNCTIONAL STATUS: ***   ALLERGIES: No Known Allergies  MEDICATION LIST PRIOR TO VISIT: Current Meds  Medication Sig  . gabapentin (NEURONTIN) 300 MG capsule Take 1 capsule by mouth 2 (two) times daily.  . hydrochlorothiazide (HYDRODIURIL) 25 MG tablet Take 1 tablet by mouth daily at 6 (six) AM.  . LORazepam (ATIVAN) 0.5 MG tablet Take 0.5 mg by mouth 2 (two) times daily as needed.  . meloxicam (MOBIC) 15 MG tablet Take 1 tablet by mouth daily.  . propranolol (INDERAL) 10 MG tablet Take 10 mg by mouth 2 (two) times daily as needed.     PAST MEDICAL HISTORY: Past Medical History:  Diagnosis Date  . Hypertension   . Immune deficiency disorder ([place])   . Iron (Fe) deficiency anemia     PAST SURGICAL HISTORY: History reviewed. No pertinent surgical history.  FAMILY HISTORY: The patient family history includes Congestive Heart  Failure (age of onset: 7) in her brother; Heart disease in her brother; Hypertension in her mother.  SOCIAL HISTORY:  The patient  reports that she has never smoked. She has never used smokeless tobacco. She reports current alcohol use. She reports current drug use. Drug: Marijuana.  REVIEW OF SYSTEMS: ROS  PHYSICAL EXAM: Vitals with BMI 12/28/2020 08/11/2018 08/11/2018  Height _0  - -  Weight 290 lbs - -  BMI 94.50 - -  Systolic 388 828 003  Diastolic 85 84 94  Pulse 89 94 100    CONSTITUTIONAL: Well-developed and well-nourished. No acute distress.  SKIN: Skin is warm and dry. No rash noted. No cyanosis. No pallor. No jaundice HEAD: Normocephalic and atraumatic.  EYES: No scleral icterus MOUTH/THROAT: Moist oral membranes.  NECK: No JVD present. No thyromegaly noted. No carotid bruits  LYMPHATIC: No visible cervical adenopathy.  CHEST Normal respiratory effort. No intercostal retractions  LUNGS: *** No stridor. No wheezes. No rales.  CARDIOVASCULAR: *** ABDOMINAL: No apparent ascites.  EXTREMITIES: No peripheral edema  HEMATOLOGIC: No significant bruising NEUROLOGIC: Oriented to person, place, and time. Nonfocal. Normal muscle tone.  PSYCHIATRIC: Normal mood and affect. Normal behavior. Cooperative  CARDIAC DATABASE: EKG: 12/28/2020: Normal sinus rhythm, 90 bpm, normal axis, without underlying ischemia or injury pattern.   Echocardiogram: No results found for this or any previous visit from the past 1095 days.    Stress Testing: No results found for this or any previous visit from the past 1095 days.   Heart Catheterization: ***  LABORATORY DATA: CBC Latest Ref Rng & Units 08/11/2018  WBC 4.0 - 10.5 K/uL 4.4  Hemoglobin 12.0 - 15.0 g/dL 10.8(L)  Hematocrit 36.0 - 46.0 % 35.7(L)  Platelets 150 - 400 K/uL 411(H)    CMP Latest Ref Rng & Units 08/11/2018  Glucose 70 - 99 mg/dL 102(H)  BUN 6 - 20 mg/dL 15  Creatinine 0.44 - 1.00 mg/dL 0.92  Sodium 135 - 145  mmol/L 137  Potassium 3.5 - 5.1 mmol/L 3.6  Chloride 98 - 111 mmol/L 98  CO2 22 - 32 mmol/L 24  Calcium 8.9 - 10.3 mg/dL 10.0    Lipid Panel  No results found for: CHOL, TRIG, HDL, CHOLHDL, VLDL, LDLCALC, LDLDIRECT, LABVLDL  No components found for: NTPROBNP No results for input(s): PROBNP in the last 8760 hours. No results for input(s): TSH in the last 8760 hours.  BMP No results for input(s): NA, K, CL, CO2, GLUCOSE, BUN, CREATININE, CALCIUM, GFRNONAA, GFRAA in the last 8760 hours.  HEMOGLOBIN A1C No results found for: HGBA1C, MPG  External Labs: Collected: 11/24/2020 provided by primary care Creatinine 0.17 mg/dL. eGFR: >60 mL/min per 1.73 m Sodium 138, potassium 4.3, chloride 103, bicarb 26 Hemoglobin A1c: 6.3 TSH: 1.26   IMPRESSION:    ICD-10-CM   1. Palpitations  R00.2 EKG 12-Lead  2. Primary hypertension  I10      RECOMMENDATIONS: Tiffany Rose is a 45 y.o. female whose past medical history and cardiac risk factors include: former smoker, benign essential hypertension, hyperlipidemia, vitamin D deficiency, anxiety/depression, anemia, obesity due to excess calories, ***    FINAL MEDICATION LIST END OF ENCOUNTER: No orders of the defined types were placed in this encounter.   Medications Discontinued During This Encounter  Medication Reason  . HYDROcodone-acetaminophen (NORCO/VICODIN) 5-325 MG tablet Error     Current Outpatient Medications:  .  gabapentin (NEURONTIN) 300 MG capsule, Take 1 capsule by mouth 2 (two) times daily., Disp: , Rfl:  .  hydrochlorothiazide (HYDRODIURIL) 25 MG tablet, Take 1 tablet by mouth daily at 6 (six) AM., Disp: , Rfl:  .  LORazepam (ATIVAN) 0.5 MG tablet, Take 0.5 mg by mouth 2 (two) times daily as needed., Disp: , Rfl:  .  meloxicam (MOBIC) 15 MG tablet, Take 1 tablet by mouth daily., Disp: , Rfl:  .  propranolol (INDERAL) 10 MG tablet, Take 10 mg by mouth 2 (two) times daily as needed., Disp: , Rfl:   Orders Placed This  Encounter  Procedures  . EKG 12-Lead    There are no Patient Instructions on file for this visit.   --Continue cardiac medications as reconciled in final medication list. --No follow-ups on file. Or sooner if needed. --Continue follow-up with your primary care physician regarding the management of your other chronic comorbid conditions.  Patient's questions and concerns were addressed to her satisfaction. She voices understanding of the instructions provided during this encounter.   This note was created using a voice recognition software as a result there may be grammatical errors inadvertently enclosed that do not reflect the nature of this encounter. Every attempt is made to correct such errors.  Rex Kras, [place], [facility]  Pager: [phone] Office: [phone]

## 2021-01-25 ENCOUNTER — Other Ambulatory Visit: Payer: Medicaid Other

## 2021-02-08 ENCOUNTER — Other Ambulatory Visit: Payer: Medicaid Other

## 2021-03-09 ENCOUNTER — Other Ambulatory Visit: Payer: Self-pay

## 2021-03-09 ENCOUNTER — Ambulatory Visit: Payer: Medicaid Other

## 2021-03-09 DIAGNOSIS — R072 Precordial pain: Secondary | ICD-10-CM

## 2021-03-18 ENCOUNTER — Telehealth: Payer: Self-pay | Admitting: Physician Assistant

## 2021-03-18 NOTE — Progress Notes (Signed)
Called and spoke with patient regarding her echocardiogram results. Patient asked if her next visit can be a telehealth. If so, she will schedule f/u after she does her stress test., that day.

## 2021-03-18 NOTE — Telephone Encounter (Signed)
Scheduled appt per referral. Pt aware.  

## 2021-03-24 NOTE — Progress Notes (Deleted)
[facility] Telephone:(336) [phone]   [phone]  CONSULT NOTE  REFERRING PHYSICIAN: Norva Riffle PA-C  REASON FOR CONSULTATION:  Iron deficiency anemia  HPI Tiffany Rose is a 45 y.o. female with a past medical history significant for hypertension, alcohol abuse, obesity, palpitations, vitamin D deficiency, and anxiety is referred to the clinic for iron deficiency anemia.  The patient recently had a follow-up appointment with her primary care provider on 03/17/21.  The patient had repeat blood work that day which showed iron deficiency anemia with a hemoglobin of 9.0, white blood cell count within normal limits at 5.6, MCV low at 71.2, and a slightly elevated platelet count at 408K.  The patient's ferritin was low at 4, her saturation ratio was also low at 4%, her TIBC was elevated at 519, and her total iron was low at 21.  She was subsequently referred to the clinic today for further evaluation and management.  Per chart review, it appears that the patient has had iron deficiency anemia for approximately _years.  She denies ever needing a blood transfusion or an iron infusion before.  She currently takes an oral iron supplement with ferrous sulfate 1 tablet p.o. daily.  The patient is compliant with this.  She denies any history of bariatric surgery.  She denies any other history of vitamin deficiencies.  Her fatigue is _.  Her menstrual cycles are _.  She denies any other abnormal bleeding or bruising including epistaxis, gingival bleeding, hemoptysis, hematemesis, melena, or hematochezia.  She has never needed a colonoscopy.  She denies any headache dizziness or lightheadedness.  She does not take any multivitamins.  She denies any particular dietary habits such as being a vegan or vegetarian.  She denies any NSAID use or blood thinners.  She denies any fever, chills, night sweats, unexplained weight loss, early satiety, abdominal pain, or lymphadenopathy.  She  denies drinking excessive tea.  Denies any  HPI  Past Medical History:  Diagnosis Date   Anxiety    Depression    Hypertension    Immune deficiency disorder (HCC)    Iron (Fe) deficiency anemia    Prediabetes     No past surgical history on file.  Family History  Problem Relation Age of Onset   Hypertension Mother    Heart disease Brother    Congestive Heart Failure Brother 73    Social History Social History   Tobacco Use   Smoking status: Never   Smokeless tobacco: Never  Vaping Use   Vaping Use: Never used  Substance Use Topics   Alcohol use: Yes    Comment: occ   Drug use: Yes    Types: Marijuana    No Known Allergies  Current Outpatient Medications  Medication Sig Dispense Refill   gabapentin (NEURONTIN) 300 MG capsule Take 1 capsule by mouth 2 (two) times daily.     hydrochlorothiazide (HYDRODIURIL) 25 MG tablet Take 1 tablet by mouth daily at 6 (six) AM.     LORazepam (ATIVAN) 0.5 MG tablet Take 0.5 mg by mouth 2 (two) times daily as needed.     meloxicam (MOBIC) 15 MG tablet Take 1 tablet by mouth daily.     propranolol (INDERAL) 10 MG tablet Take 10 mg by mouth 2 (two) times daily as needed.     No current facility-administered medications for this visit.    REVIEW OF SYSTEMS:   Review of Systems  Constitutional: Negative for appetite change, chills, fatigue, fever and unexpected  weight change.  HENT:   Negative for mouth sores, nosebleeds, sore throat and trouble swallowing.   Eyes: Negative for eye problems and icterus.  Respiratory: Negative for cough, hemoptysis, shortness of breath and wheezing.   Cardiovascular: Negative for chest pain and leg swelling.  Gastrointestinal: Negative for abdominal pain, constipation, diarrhea, nausea and vomiting.  Genitourinary: Negative for bladder incontinence, difficulty urinating, dysuria, frequency and hematuria.   Musculoskeletal: Negative for back pain, gait problem, neck pain and neck stiffness.  Skin:  Negative for itching and rash.  Neurological: Negative for dizziness, extremity weakness, gait problem, headaches, light-headedness and seizures.  Hematological: Negative for adenopathy. Does not bruise/bleed easily.  Psychiatric/Behavioral: Negative for confusion, depression and sleep disturbance. The patient is not nervous/anxious.     PHYSICAL EXAMINATION:  There were no vitals taken for this visit.  ECOG PERFORMANCE STATUS: {CHL ONC ECOG [id number]  Physical Exam  Constitutional: Oriented to person, place, and time and well-developed, well-nourished, and in no distress. No distress.  HENT:  Head: Normocephalic and atraumatic.  Mouth/Throat: Oropharynx is clear and moist. No oropharyngeal exudate.  Eyes: Conjunctivae are normal. Right eye exhibits no discharge. Left eye exhibits no discharge. No scleral icterus.  Neck: Normal range of motion. Neck supple.  Cardiovascular: Normal rate, regular rhythm, normal heart sounds and intact distal pulses.   Pulmonary/Chest: Effort normal and breath sounds normal. No respiratory distress. No wheezes. No rales.  Abdominal: Soft. Bowel sounds are normal. Exhibits no distension and no mass. There is no tenderness.  Musculoskeletal: Normal range of motion. Exhibits no edema.  Lymphadenopathy:    No cervical adenopathy.  Neurological: Alert and oriented to person, place, and time. Exhibits normal muscle tone. Gait normal. Coordination normal.  Skin: Skin is warm and dry. No rash noted. Not diaphoretic. No erythema. No pallor.  Psychiatric: Mood, memory and judgment normal.  Vitals reviewed.  LABORATORY DATA: Lab Results  Component Value Date   WBC 4.4 08/11/2018   HGB 10.8 (L) 08/11/2018   HCT 35.7 (L) 08/11/2018   MCV 70.0 (L) 08/11/2018   PLT 411 (H) 08/11/2018      Chemistry      Component Value Date/Time   NA 137 08/11/2018 1207   K 3.6 08/11/2018 1207   CL 98 08/11/2018 1207   CO2 24 08/11/2018 1207   BUN 15 08/11/2018 1207    CREATININE 0.92 08/11/2018 1207      Component Value Date/Time   CALCIUM 10.0 08/11/2018 1207       RADIOGRAPHIC STUDIES: PCV ECHOCARDIOGRAM COMPLETE  Addendum Date: 03/12/2021   Echocardiogram 03/09/2021: Left ventricle cavity is normal in size. 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 Normal LV systolic function with EF 58%. Normal global wall motion. Doppler evidence of grade I (impaired) diastolic dysfunction, normal LAP. 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 No significant valvular abnormality. Normal right atrial pressure.   Result Date: 03/12/2021 Echocardiogram 03/09/2021: Left ventricle cavity is normal in size. 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 Normal LV systolic function with EF 58%. Normal global wall motion. Doppler evidence of grade I (impaired) diastolic dysfunction, normal LAP. No significant valvular abnormality. Normal right atrial pressure.    ASSESSMENT: This is a very pleasant 45 year old female referred to the clinic for iron deficiency anemia.   PLAN: The patient was seen with Dr. Arbutus Ped today.  The patient repeat lab studies including a CBC, CMP, iron studies, ferritin, B12, folic acid, SPEP with immunofixation, hemoglobin electrophoresis, and stool cards.  The patient's  labs from today show  a Hgb _, HCT _, MCV _, Iron _, TIBC _, Sat ratio of _%, Ferritin <_, B12 _, and Folate _.   We will arrange for the patient to receive B12 injections weekly x4 and then monthly there on after.    We will arrange for the patient to have iron infusions with Venofer weekly x3 300 mg during next week.  I will arrange for follow-up visit in 6 weeks for evaluation and repeat iron studies.  The patient was given stool cards.  She was instructed to return these to the clinic at her earliest convenience. Should this be positive for blood, we would refer the patient to gastroenterology.   The patient was also instructed to  take over-the-counter iron supplements with vitamin C to help with absorption.  She is also given a handout on iron rich foods.  The patient voices understanding of current disease status and treatment options and is in agreement with the current care plan.  All questions were answered. The patient knows to call the clinic with any problems, questions or concerns. We can certainly see the patient much sooner if necessary.  Thank you so much for allowing me to participate in the care of [facility]. I will continue to follow up the patient with you and assist in her care.  I spent {CHL ONC TIME VISIT - IRWER:[id number]} counseling the patient face to face. The total time spent in the appointment was {CHL ONC TIME VISIT - PJKDT:[id number]}.  Disclaimer: This note was dictated with voice recognition software. Similar sounding words can inadvertently be transcribed and may not be corrected upon review.   Javonne Dorko L Verna Hamon March 28, 2021, 1:22 PM

## 2021-03-25 ENCOUNTER — Other Ambulatory Visit: Payer: Self-pay | Admitting: Physician Assistant

## 2021-03-25 DIAGNOSIS — D649 Anemia, unspecified: Secondary | ICD-10-CM

## 2021-03-28 ENCOUNTER — Telehealth: Payer: Self-pay | Admitting: Physician Assistant

## 2021-03-28 ENCOUNTER — Inpatient Hospital Stay: Payer: Medicaid Other

## 2021-03-28 ENCOUNTER — Inpatient Hospital Stay: Payer: Medicaid Other | Admitting: Physician Assistant

## 2021-03-28 NOTE — Telephone Encounter (Signed)
Pt has been rescheduled to see Karena Addison on 8/12 at 9am.

## 2021-04-07 NOTE — Progress Notes (Deleted)
[facility] Telephone:(336) [phone]   Fax:([phone]  INITIAL CONSULT NOTE  Patient Care Team: Norm Salt, PA as PCP - General (Physician Assistant)  Hematological/Oncological History 1) Labs from PCP, Dr. Jackie Plum from [facility] -11/24/2020: Iron 20 (L), TIBC 544 (H(, Iron saturation 4% (L) -03/15/2021: WBC 5.6, Hgb 9.0 (L), MCV 71.2 (L), Plt 408 (H), Iron 21 (L), TIBC 519 (H), Iron saturation 4% (L), Ferritin 4 (L)  2) 04/08/2021: Establish care with Georga Kaufmann PA-C  CHIEF COMPLAINTS/PURPOSE OF CONSULTATION:  "*** "  HISTORY OF PRESENTING ILLNESS:  Tiffany Rose 45 y.o. female with medical history significant for ***  On review of the previous records ***  On exam today ***  MEDICAL HISTORY:  Past Medical History:  Diagnosis Date   Anxiety    Depression    Hypertension    Immune deficiency disorder (HCC)    Iron (Fe) deficiency anemia    Prediabetes     SURGICAL HISTORY: No past surgical history on file.  SOCIAL HISTORY: Social History   Socioeconomic History   Marital status: Single    Spouse name: Not on file   Number of children: 2   Years of education: Not on file   Highest education level: Not on file  Occupational History   Not on file  Tobacco Use   Smoking status: Never   Smokeless tobacco: Never  Vaping Use   Vaping Use: Never used  Substance and Sexual Activity   Alcohol use: Yes    Comment: occ   Drug use: Yes    Types: Marijuana   Sexual activity: Not Currently  Other Topics Concern   Not on file  Social History Narrative   Not on file   Social Determinants of Health   Financial Resource Strain: Not on file  Food Insecurity: Not on file  Transportation Needs: Not on file  Physical Activity: Not on file  Stress: Not on file  Social Connections: Not on file  Intimate Partner Violence: Not on file    FAMILY HISTORY: Family History  Problem Relation Age of Onset   Hypertension  Mother    Heart disease Brother    Congestive Heart Failure Brother 41    ALLERGIES:  has No Known Allergies.  MEDICATIONS:  Current Outpatient Medications  Medication Sig Dispense Refill   gabapentin (NEURONTIN) 300 MG capsule Take 1 capsule by mouth 2 (two) times daily.     hydrochlorothiazide (HYDRODIURIL) 25 MG tablet Take 1 tablet by mouth daily at 6 (six) AM.     LORazepam (ATIVAN) 0.5 MG tablet Take 0.5 mg by mouth 2 (two) times daily as needed.     meloxicam (MOBIC) 15 MG tablet Take 1 tablet by mouth daily.     propranolol (INDERAL) 10 MG tablet Take 10 mg by mouth 2 (two) times daily as needed.     No current facility-administered medications for this visit.    REVIEW OF SYSTEMS:   Constitutional: ( - ) fevers, ( - )  chills , ( - ) night sweats Eyes: ( - ) blurriness of vision, ( - ) double vision, ( - ) watery eyes Ears, nose, mouth, throat, and face: ( - ) mucositis, ( - ) sore throat Respiratory: ( - ) cough, ( - ) dyspnea, ( - ) wheezes Cardiovascular: ( - ) palpitation, ( - ) chest discomfort, ( - ) lower extremity swelling Gastrointestinal:  ( - ) nausea, ( - ) heartburn, ( - )  change in bowel habits Skin: ( - ) abnormal skin rashes Lymphatics: ( - ) new lymphadenopathy, ( - ) easy bruising Neurological: ( - ) numbness, ( - ) tingling, ( - ) new weaknesses Behavioral/Psych: ( - ) mood change, ( - ) new changes  All other systems were reviewed with the patient and are negative.  PHYSICAL EXAMINATION: ECOG PERFORMANCE STATUS: {CHL ONC ECOG [phone]}  There were no vitals filed for this visit. There were no vitals filed for this visit.  GENERAL: well appearing *** in NAD  SKIN: skin color, texture, turgor are normal, no rashes or significant lesions EYES: conjunctiva are pink and non-injected, sclera clear OROPHARYNX: no exudate, no erythema; lips, buccal mucosa, and tongue normal  NECK: supple, non-tender LYMPH:  no palpable lymphadenopathy in the  cervical, axillary or supraclavicular lymph nodes.  LUNGS: clear to auscultation and percussion with normal breathing effort HEART: regular rate & rhythm and no murmurs and no lower extremity edema ABDOMEN: soft, non-tender, non-distended, normal bowel sounds Musculoskeletal: no cyanosis of digits and no clubbing  PSYCH: alert & oriented x 3, fluent speech NEURO: no focal motor/sensory deficits  LABORATORY DATA:  I have reviewed the data as listed CBC Latest Ref Rng & Units 08/11/2018  WBC 4.0 - 10.5 K/uL 4.4  Hemoglobin 12.0 - 15.0 g/dL 10.8(L)  Hematocrit 36.0 - 46.0 % 35.7(L)  Platelets 150 - 400 K/uL 411(H)    CMP Latest Ref Rng & Units 08/11/2018  Glucose 70 - 99 mg/dL 353(I)  BUN 6 - 20 mg/dL 15  Creatinine 1.44 - 3.15 mg/dL 4.00  Sodium 867 - 619 mmol/L 137  Potassium 3.5 - 5.1 mmol/L 3.6  Chloride 98 - 111 mmol/L 98  CO2 22 - 32 mmol/L 24  Calcium 8.9 - 10.3 mg/dL 50.9     PATHOLOGY: ***  BLOOD FILM: *** Review of the peripheral blood smear showed normal appearing white cells with neutrophils that were appropriately lobated and granulated. There was no predominance of bi-lobed or hyper-segmented neutrophils appreciated. No Dohle bodies were noted. There was no left shifting, immature forms or blasts noted. Lymphocytes remain normal in size without any predominance of large granular lymphocytes. Red cells show no anisopoikilocytosis, macrocytes , microcytes or polychromasia. There were no schistocytes, target cells, echinocytes, acanthocytes, dacrocytes, or stomatocytes.There was no rouleaux formation, nucleated red cells, or intra-cellular inclusions noted. The platelets are normal in size, shape, and color without any clumping evident.  RADIOGRAPHIC STUDIES: I have personally reviewed the radiological images as listed and agreed with the findings in the report. PCV ECHOCARDIOGRAM COMPLETE  Addendum Date: 03/12/2021   Echocardiogram 03/09/2021: Left ventricle cavity is  normal in size. 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 Normal LV systolic function with EF 58%. Normal global wall motion. Doppler evidence of grade I (impaired) diastolic dysfunction, normal LAP. 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 No significant valvular abnormality. Normal right atrial pressure.   Result Date: 03/12/2021 Echocardiogram 03/09/2021: Left ventricle cavity is normal in size. 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 Normal LV systolic function with EF 58%. Normal global wall motion. Doppler evidence of grade I (impaired) diastolic dysfunction, normal LAP. No significant valvular abnormality. Normal right atrial pressure.    ASSESSMENT & PLAN ***  No orders of the defined types were placed in this encounter.   All questions were answered. The patient knows to call the clinic with any problems, questions or concerns.  I have spent a total of {CHL ONC TIME  VISIT - PNPYY:[id number]} minutes of face-to-face and non-face-to-face time, preparing to see the patient, obtaining and/or reviewing separately obtained history, performing a medically appropriate examination, counseling and educating the patient, ordering medications/tests/procedures, referring and communicating with other health care professionals, documenting clinical information in the electronic health record, independently interpreting results and communicating results to the patient, and care coordination.   Georga Kaufmann, PA-C Department of Hematology/Oncology [facility] Phone: [phone]

## 2021-04-08 ENCOUNTER — Inpatient Hospital Stay: Payer: Medicaid Other | Attending: Physician Assistant | Admitting: Physician Assistant

## 2021-04-08 ENCOUNTER — Inpatient Hospital Stay: Payer: Medicaid Other

## 2021-04-12 NOTE — Progress Notes (Signed)
Please call patient to change her 04/15/21 office visit to a telehealth visit. Thank you in advance.

## 2021-06-02 ENCOUNTER — Telehealth: Payer: Medicaid Other | Admitting: Cardiology

## 2021-07-01 ENCOUNTER — Telehealth: Payer: Self-pay | Admitting: Physician Assistant

## 2021-07-01 NOTE — Telephone Encounter (Signed)
Scheduled appt per 11/3 referral. Pt is aware of appt date and time.  

## 2021-07-18 NOTE — Progress Notes (Deleted)
[facility] Telephone:([phone]   Fax:(336) [place] NOTE  Patient Care Team: Trey Sailors, PA as PCP - General (Physician Assistant)  Hematological/Oncological History 1) Labs from PCP, Joneen Boers PA-C -11/24/2020: Serum iron 20 (L), TIBC 544 (H), Iron saturation 4% (L) -03/15/2021: WBC 5.6, Hgb 9 (L), MCV 71.2 (L), Plt 408 (H), Serum iron 21 (L), TIBC 519 (H), iron saturation 4% (L), ferritin 4 (L)  2) 07/19/2021: Establish care with Dede Query PA-C  CHIEF COMPLAINTS/PURPOSE OF CONSULTATION:  "Iron deficiency anemia "  HISTORY OF PRESENTING ILLNESS:  Tiffany Rose 45 y.o. female with medical history significant for ***  On review of the previous records ***  On exam today ***  MEDICAL HISTORY:  Past Medical History:  Diagnosis Date   Anxiety    Depression    Hypertension    Immune deficiency disorder ([place])    Iron (Fe) deficiency anemia    Prediabetes     SURGICAL HISTORY: No past surgical history on file.  SOCIAL HISTORY: Social History   Socioeconomic History   Marital status: Single    Spouse name: Not on file   Number of children: 2   Years of education: Not on file   Highest education level: Not on file  Occupational History   Not on file  Tobacco Use   Smoking status: Never   Smokeless tobacco: Never  Vaping Use   Vaping Use: Never used  Substance and Sexual Activity   Alcohol use: Yes    Comment: occ   Drug use: Yes    Types: Marijuana   Sexual activity: Not Currently  Other Topics Concern   Not on file  Social History Narrative   Not on file   Social Determinants of Health   Financial Resource Strain: Not on file  Food Insecurity: Not on file  Transportation Needs: Not on file  Physical Activity: Not on file  Stress: Not on file  Social Connections: Not on file  Intimate Partner Violence: Not on file    FAMILY HISTORY: Family History  Problem Relation Age of Onset   Hypertension  Mother    Heart disease Brother    Congestive Heart Failure Brother 28    ALLERGIES:  has No Known Allergies.  MEDICATIONS:  Current Outpatient Medications  Medication Sig Dispense Refill   gabapentin (NEURONTIN) 300 MG capsule Take 1 capsule by mouth 2 (two) times daily.     hydrochlorothiazide (HYDRODIURIL) 25 MG tablet Take 1 tablet by mouth daily at 6 (six) AM.     LORazepam (ATIVAN) 0.5 MG tablet Take 0.5 mg by mouth 2 (two) times daily as needed.     meloxicam (MOBIC) 15 MG tablet Take 1 tablet by mouth daily.     propranolol (INDERAL) 10 MG tablet Take 10 mg by mouth 2 (two) times daily as needed.     No current facility-administered medications for this visit.    REVIEW OF SYSTEMS:   Constitutional: ( - ) fevers, ( - )  chills , ( - ) night sweats Eyes: ( - ) blurriness of vision, ( - ) double vision, ( - ) watery eyes Ears, nose, mouth, throat, and face: ( - ) mucositis, ( - ) sore throat Respiratory: ( - ) cough, ( - ) dyspnea, ( - ) wheezes Cardiovascular: ( - ) palpitation, ( - ) chest discomfort, ( - ) lower extremity swelling Gastrointestinal:  ( - ) nausea, ( - ) heartburn, ( - )  change in bowel habits Skin: ( - ) abnormal skin rashes Lymphatics: ( - ) new lymphadenopathy, ( - ) easy bruising Neurological: ( - ) numbness, ( - ) tingling, ( - ) new weaknesses Behavioral/Psych: ( - ) mood change, ( - ) new changes  All other systems were reviewed with the patient and are negative.  PHYSICAL EXAMINATION: ECOG PERFORMANCE STATUS: {CHL ONC ECOG PS:[phone]}  There were no vitals filed for this visit. There were no vitals filed for this visit.  GENERAL: well appearing *** in NAD  SKIN: skin color, texture, turgor are normal, no rashes or significant lesions EYES: conjunctiva are pink and non-injected, sclera clear OROPHARYNX: no exudate, no erythema; lips, buccal mucosa, and tongue normal  NECK: supple, non-tender LYMPH:  no palpable lymphadenopathy in the  cervical, axillary or supraclavicular lymph nodes.  LUNGS: clear to auscultation and percussion with normal breathing effort HEART: regular rate & rhythm and no murmurs and no lower extremity edema ABDOMEN: soft, non-tender, non-distended, normal bowel sounds Musculoskeletal: no cyanosis of digits and no clubbing  PSYCH: alert & oriented x 3, fluent speech NEURO: no focal motor/sensory deficits  LABORATORY DATA:  I have reviewed the data as listed CBC Latest Ref Rng & Units 08/11/2018  WBC 4.0 - 10.5 K/uL 4.4  Hemoglobin 12.0 - 15.0 g/dL 10.8(L)  Hematocrit 36.0 - 46.0 % 35.7(L)  Platelets 150 - 400 K/uL 411(H)    CMP Latest Ref Rng & Units 08/11/2018  Glucose 70 - 99 mg/dL 235(T)  BUN 6 - 20 mg/dL 15  Creatinine 7.32 - 2.02 mg/dL 5.42  Sodium 706 - 237 mmol/L 137  Potassium 3.5 - 5.1 mmol/L 3.6  Chloride 98 - 111 mmol/L 98  CO2 22 - 32 mmol/L 24  Calcium 8.9 - 10.3 mg/dL 62.8     PATHOLOGY: ***  BLOOD FILM: *** Review of the peripheral blood smear showed normal appearing white cells with neutrophils that were appropriately lobated and granulated. There was no predominance of bi-lobed or hyper-segmented neutrophils appreciated. No Dohle bodies were noted. There was no left shifting, immature forms or blasts noted. Lymphocytes remain normal in size without any predominance of large granular lymphocytes. Red cells show no anisopoikilocytosis, macrocytes , microcytes or polychromasia. There were no schistocytes, target cells, echinocytes, acanthocytes, dacrocytes, or stomatocytes.There was no rouleaux formation, nucleated red cells, or intra-cellular inclusions noted. The platelets are normal in size, shape, and color without any clumping evident.  RADIOGRAPHIC STUDIES: I have personally reviewed the radiological images as listed and agreed with the findings in the report. No results found.  ASSESSMENT & PLAN ***  No orders of the defined types were placed in this  encounter.   All questions were answered. The patient knows to call the clinic with any problems, questions or concerns.  I have spent a total of {CHL ONC TIME VISIT - BTDVV:[id number]} minutes of face-to-face and non-face-to-face time, preparing to see the patient, obtaining and/or reviewing separately obtained history, performing a medically appropriate examination, counseling and educating the patient, ordering medications/tests/procedures, referring and communicating with other health care professionals, documenting clinical information in the electronic health record, independently interpreting results and communicating results to the patient, and care coordination.   Georga Kaufmann, PA-C Department of Hematology/Oncology [facility] at [facility] Phone: [phone]

## 2021-07-19 ENCOUNTER — Inpatient Hospital Stay: Payer: Medicaid Other

## 2021-07-19 ENCOUNTER — Telehealth: Payer: Self-pay | Admitting: Hematology

## 2021-07-19 ENCOUNTER — Inpatient Hospital Stay: Payer: Medicaid Other | Admitting: Physician Assistant

## 2021-07-19 NOTE — Telephone Encounter (Signed)
Pt called in stating she was sick and needed to r/s her new hem appt. I r/s her appt. She is aware of new appt date and time.

## 2021-07-28 ENCOUNTER — Other Ambulatory Visit: Payer: Self-pay

## 2021-07-28 ENCOUNTER — Ambulatory Visit (HOSPITAL_COMMUNITY)
Admission: EM | Admit: 2021-07-28 | Discharge: 2021-07-28 | Disposition: A | Discharge status: Home / Self Care | Payer: Medicaid Other

## 2021-07-28 ENCOUNTER — Encounter (HOSPITAL_COMMUNITY): Payer: Self-pay | Admitting: Emergency Medicine

## 2021-07-28 ENCOUNTER — Encounter (HOSPITAL_COMMUNITY): Payer: Self-pay

## 2021-07-28 ENCOUNTER — Emergency Department (HOSPITAL_COMMUNITY)
Admission: EM | Admit: 2021-07-28 | Discharge: 2021-07-28 | Disposition: A | Discharge status: Home / Self Care | Payer: Medicaid Other | Attending: Emergency Medicine | Admitting: Emergency Medicine

## 2021-07-28 DIAGNOSIS — Z5321 Procedure and treatment not carried out due to patient leaving prior to being seen by health care provider: Secondary | ICD-10-CM | POA: Insufficient documentation

## 2021-07-28 DIAGNOSIS — N6001 Solitary cyst of right breast: Secondary | ICD-10-CM

## 2021-07-28 DIAGNOSIS — N611 Abscess of the breast and nipple: Secondary | ICD-10-CM | POA: Insufficient documentation

## 2021-07-28 MED ORDER — DOXYCYCLINE HYCLATE 100 MG PO CAPS: 100.0000 mg | ORAL_CAPSULE | Freq: Two times a day (BID) | ORAL | 0 refills | Status: AC

## 2021-07-28 MED ORDER — TRAMADOL HCL 50 MG PO TABS: 50.0000 mg | ORAL_TABLET | Freq: Every evening | ORAL | 0 refills | Status: AC | PRN

## 2021-07-28 MED ORDER — IBUPROFEN 800 MG PO TABS: 800.0000 mg | ORAL_TABLET | Freq: Three times a day (TID) | ORAL | 0 refills | Status: AC

## 2021-07-28 MED ORDER — ACETAMINOPHEN 500 MG PO TABS: 1000.0000 mg | ORAL_TABLET | Freq: Four times a day (QID) | ORAL | 0 refills | Status: AC | PRN

## 2021-07-28 NOTE — Discharge Instructions (Addendum)
Take doxycycline twice a day for 7 days  May use ibuprofen every 8 hours as needed in addition to Tylenol every 6 hours  May use tramadol at bedtime for additional comfort  Hold warm-hot compresses to affected area at least 4 times a day, this helps to facilitate draining, the more the better  Please return for evaluation for increased swelling, increased tenderness or pain, non healing site, non draining site, you begin to have fever or chills   Please have your primary doctor placed a referral to surgery for evaluation of sac removal  We reviewed the etiology of recurrent abscesses of skin.  Skin abscesses are collections of pus within the dermis and deeper skin tissues. Skin abscesses manifest as painful, tender, fluctuant, and erythematous nodules, frequently surmounted by a pustule and surrounded by a rim of erythematous swelling.  Spontaneous drainage of purulent material may occur.  Fever can occur on occasion.    -Skin abscesses can develop in healthy individuals with no predisposing conditions other than skin or nasal carriage of Staphylococcus aureus.  Individuals in close contact with others who have active infection with skin abscesses are at increased risk which is likely to explain why twin brother has similar episodes.   In addition, any process leading to a breach in the skin barrier can also predispose to the development of a skin abscesses, such as atopic dermatitis.

## 2021-07-28 NOTE — ED Triage Notes (Signed)
Pt presents for cyst of left breast x 2-3 days.

## 2021-07-28 NOTE — ED Triage Notes (Signed)
Patient complains of a painful abscess under left breast that appeared week ago. Patient alert, oriented, and in no apparent distress at this time.

## 2021-07-28 NOTE — ED Provider Notes (Signed)
[facility]    CSN: [id number] Arrival date & time: 07/28/21  1227      History   Chief Complaint No chief complaint on file.   HPI Tiffany Rose is a 45 y.o. female.   Patient presents with cyst under her right breast for 6 days.  Painful, tender and increased in size.  Symptoms worsened over the last two days. Saw pcp yesterday who recommended UC because they do not do in office incision and drainage.. Denies drainage, fever, chills. History of htn, prediabetes. Has not attempted treatment.  Has had cyst under her left breast requiring surgical intervention for sac removal.  Past Medical History:  Diagnosis Date   Anxiety    Depression    Hypertension    Immune deficiency disorder (HCC)    Iron (Fe) deficiency anemia    Prediabetes     There are no problems to display for this patient.   History reviewed. No pertinent surgical history.  OB History   No obstetric history on file.      Home Medications    Prior to Admission medications   Medication Sig Start Date End Date Taking? Authorizing Provider  Vitamin D, Ergocalciferol, (DRISDOL) 1.25 MG (50000 UNIT) CAPS capsule 1 cap(s) 06/22/21  Yes [provider]  busPIRone (BUSPAR) 5 MG tablet Take 5 mg by mouth 2 (two) times daily. 07/13/21   [provider]  gabapentin (NEURONTIN) 300 MG capsule Take 1 capsule by mouth 2 (two) times daily. 11/03/20   [provider]  hydrochlorothiazide (HYDRODIURIL) 25 MG tablet Take 1 tablet by mouth daily at 6 (six) AM.    [provider]  LORazepam (ATIVAN) 0.5 MG tablet Take 0.5 mg by mouth 2 (two) times daily as needed. 12/08/20   [provider]  meloxicam (MOBIC) 15 MG tablet Take 1 tablet by mouth daily. 11/03/20   [provider]  propranolol (INDERAL) 10 MG tablet Take 10 mg by mouth 2 (two) times daily as needed. 11/24/20   [provider]    Family History Family History  Problem Relation Age of  Onset   Hypertension Mother    Heart disease Brother    Congestive Heart Failure Brother 18    Social History Social History   Tobacco Use   Smoking status: Never   Smokeless tobacco: Never  Vaping Use   Vaping Use: Never used  Substance Use Topics   Alcohol use: Yes    Comment: occ   Drug use: Yes    Types: Marijuana     Allergies   Patient has no known allergies.   Review of Systems Review of Systems  Constitutional: Negative.   Respiratory: Negative.    Cardiovascular: Negative.   Neurological: Negative.     Physical Exam Triage Vital Signs ED Triage Vitals  Enc Vitals Group     BP 07/28/21 1443 (!) 181/118     Pulse Rate 07/28/21 1443 (!) 101     Resp 07/28/21 1443 20     Temp 07/28/21 1443 98.1 F (36.7 C)     Temp Source 07/28/21 1443 Oral     SpO2 07/28/21 1443 100 %     Weight --      Height --      Head Circumference --      Peak Flow --      Pain Score 07/28/21 1444 10     Pain Loc --      Pain Edu? --  Excl. in GC? --    No data found.  Updated Vital Signs BP (!) 181/118 (BP Location: Left Arm)   Pulse (!) 101   Temp 98.1 F (36.7 C) (Oral)   Resp 20   LMP 07/12/2021 (Approximate)   SpO2 100%   Visual Acuity Right Eye Distance:   Left Eye Distance:   Bilateral Distance:    Right Eye Near:   Left Eye Near:    Bilateral Near:     Physical Exam Constitutional:      Appearance: Normal appearance. She is normal weight.  HENT:     Head: Normocephalic.  Eyes:     Extraocular Movements: Extraocular movements intact.  Cardiovascular:     Rate and Rhythm: Regular rhythm. Tachycardia present.     Pulses: Normal pulses.     Heart sounds: Normal heart sounds.  Pulmonary:     Effort: Pulmonary effort is normal.     Breath sounds: Normal breath sounds.  Skin:    Comments: 2 x 3 cm cyst present under the right breast on the bra line, erythematous and swollen, tender to touch, no drainage noted but Tiffany Rose pus at the head   Neurological:     Mental Status: She is alert and oriented to person, place, and time. Mental status is at baseline.  Psychiatric:        Mood and Affect: Mood normal.        Behavior: Behavior normal.     UC Treatments / Results  Labs (all labs ordered are listed, but only abnormal results are displayed) Labs Reviewed - No data to display  EKG   Radiology No results found.  Procedures Incision and Drainage  Date/Time: 07/28/2021 4:24 PM Performed by: Valinda Hoar, NP Authorized by: Valinda Hoar, NP   Consent:    Consent obtained:  Verbal   Consent given by:  Patient   Risks, benefits, and alternatives were discussed: yes     Risks discussed:  Incomplete drainage, pain and infection   Alternatives discussed:  No treatment Universal protocol:    Procedure explained and questions answered to patient or proxy's satisfaction: yes     Patient identity confirmed:  Verbally with patient Location:    Type:  Abscess   Size:  2x3   Location: Right breast. Pre-procedure details:    Skin preparation:  Chlorhexidine with alcohol Sedation:    Sedation type:  None Anesthesia:    Anesthesia method:  Local infiltration   Local anesthetic:  Lidocaine 1% w/o epi Procedure type:    Complexity:  Simple Procedure details:    Ultrasound guidance: no     Needle aspiration: no     Incision types:  Single straight   Drainage:  Purulent   Drainage amount:  Copious   Wound treatment:  Wound left open   Packing materials:  None Post-procedure details:    Procedure completion:  Tolerated (including critical care time)  Medications Ordered in UC Medications - No data to display  Initial Impression / Assessment and Plan / UC Course  I have reviewed the triage vital signs and the nursing notes.  Pertinent labs & imaging results that were available during my care of the patient were reviewed by me and considered in my medical decision making (see chart for details).  Cyst  of the right breast  1.  Doxycycline 100 mg twice daily for 7 days 2.  Warm compresses to the affected area at least 4 times daily 3.  Urgent  care follow-up as needed for nonhealing nondraining site 4.  Patient to obtain referral for [place] surgery from PCP 5.  Tramadol 50 mg at bedtime, PDMP reviewed, low risk, 5 tablets prescribed 6.  Ibuprofen 800 mg 3 times daily as needed 7.  Tylenol 1000 mg every 6 hours as needed Final Clinical Impressions(s) / UC Diagnoses   Final diagnoses:  None   Discharge Instructions   None    ED Prescriptions   None    PDMP not reviewed this encounter.   Valinda Hoar, NP 07/28/21 1626

## 2021-07-28 NOTE — ED Provider Notes (Signed)
Emergency Medicine Provider Triage Evaluation Note  Tiffany Rose , a 45 y.o. female  was evaluated in triage.  Pt complains of abscess.  Same is located under her right breast, has been present for 6 days with significant pain over the past 2 days.  She has had this drained 3 times in the past 2 years.  She denies any drainage, fevers, chills, or abdominal pain.  She has been trying Tylenol and ibuprofen for symptoms without relief.  Review of Systems  Positive:  Negative: See above  Physical Exam  BP (!) 146/105 (BP Location: Right Arm)   Pulse 85   Temp 99 F (37.2 C)   Resp 16   SpO2 99%  Gen:   Awake, no distress   Resp:  Normal effort  MSK:   Moves extremities without difficulty  Other:    Medical Decision Making  Medically screening exam initiated at 10:20 AM.  Appropriate orders placed.  Tiffany Rose was informed that the remainder of the evaluation will be completed by another provider, this initial triage assessment does not replace that evaluation, and the importance of remaining in the ED until their evaluation is complete.     Silva Bandy, PA-C 07/28/21 1022    Rozelle Logan, DO 07/29/21 1015

## 2021-08-02 ENCOUNTER — Inpatient Hospital Stay: Payer: Medicaid Other | Attending: Physician Assistant | Admitting: Hematology

## 2021-08-08 ENCOUNTER — Telehealth: Payer: Self-pay | Admitting: Physician Assistant

## 2021-08-08 NOTE — Telephone Encounter (Signed)
Pt no showed her new hem appt with Dr. Candise Che. I spoke to the pt this morning and she said she would like to r/s for afternoon. I r/s her new hem appt and made her aware of new appt date and time.

## 2021-08-19 ENCOUNTER — Other Ambulatory Visit (HOSPITAL_COMMUNITY): Payer: Self-pay

## 2021-08-19 ENCOUNTER — Ambulatory Visit (HOSPITAL_COMMUNITY)
Admission: EM | Admit: 2021-08-19 | Discharge: 2021-08-19 | Disposition: A | Discharge status: Home / Self Care | Payer: Medicaid Other | Attending: Family Medicine | Admitting: Family Medicine

## 2021-08-19 ENCOUNTER — Encounter (HOSPITAL_COMMUNITY): Payer: Self-pay

## 2021-08-19 ENCOUNTER — Other Ambulatory Visit: Payer: Self-pay

## 2021-08-19 ENCOUNTER — Telehealth (HOSPITAL_COMMUNITY): Payer: Self-pay

## 2021-08-19 DIAGNOSIS — B370 Candidal stomatitis: Secondary | ICD-10-CM

## 2021-08-19 MED ORDER — FLUCONAZOLE 100 MG PO TABS
100.0000 mg | ORAL_TABLET | ORAL | 0 refills | Status: AC
  Filled 2021-08-19: qty 8, 7d supply, fill #0

## 2021-08-19 MED ORDER — FLUCONAZOLE 100 MG PO TABS: ORAL_TABLET | ORAL | 0 refills | Status: DC

## 2021-08-19 NOTE — Discharge Instructions (Addendum)
Fluconazole 100 mg--take 2 tabs together the first day, then 1 daily for 6 more days.

## 2021-08-19 NOTE — ED Triage Notes (Signed)
Pt presents with oral thrush after finishing antibiotics for an abscess X 2 weeks ago.

## 2021-08-19 NOTE — ED Provider Notes (Signed)
[facility]    CSN: [id number] Arrival date & time: 08/19/21  1006      History   Chief Complaint Chief Complaint  Patient presents with   Thrush     HPI Tiffany Rose is a 45 y.o. female.   HPI Here for a 3 day h/o soreness in her mouth and on her tongue. Finished amoxicillin the day before and she had taken doxycycline also before that, for an abscess. The abscess is healing up, and she has surgery planned for after the first of the year.  No f/c/n/v/d. LMP 08/01/21  Past Medical History:  Diagnosis Date   Anxiety    Depression    Hypertension    Immune deficiency disorder (HCC)    Iron (Fe) deficiency anemia    Prediabetes     There are no problems to display for this patient.   History reviewed. No pertinent surgical history.  OB History   No obstetric history on file.      Home Medications    Prior to Admission medications   Medication Sig Start Date End Date Taking? Authorizing Provider  fluconazole (DIFLUCAN) 100 MG tablet 2 tabs orally the first day, then 1 tab daily for 6 more days. 08/19/21  Yes Zenia Resides, MD  acetaminophen (TYLENOL) 500 MG tablet Take 2 tablets (1,000 mg total) by mouth every 6 (six) hours as needed. 07/28/21   White, Elita Boone, NP  busPIRone (BUSPAR) 5 MG tablet Take 5 mg by mouth 2 (two) times daily. 07/13/21   [provider]  doxycycline (VIBRAMYCIN) 100 MG capsule Take 1 capsule (100 mg total) by mouth 2 (two) times daily. 07/28/21   White, Elita Boone, NP  gabapentin (NEURONTIN) 300 MG capsule Take 1 capsule by mouth 2 (two) times daily. 11/03/20   [provider]  hydrochlorothiazide (HYDRODIURIL) 25 MG tablet Take 1 tablet by mouth daily at 6 (six) AM.    [provider]  ibuprofen (ADVIL) 800 MG tablet Take 1 tablet (800 mg total) by mouth 3 (three) times daily. 07/28/21   White, Elita Boone, NP  LORazepam (ATIVAN) 0.5 MG tablet Take 0.5 mg by mouth 2 (two) times daily as needed.  12/08/20   [provider]  meloxicam (MOBIC) 15 MG tablet Take 1 tablet by mouth daily. 11/03/20   [provider]  propranolol (INDERAL) 10 MG tablet Take 10 mg by mouth 2 (two) times daily as needed. 11/24/20   [provider]  traMADol (ULTRAM) 50 MG tablet Take 1 tablet (50 mg total) by mouth at bedtime as needed. 07/28/21   Valinda Hoar, NP  Vitamin D, Ergocalciferol, (DRISDOL) 1.25 MG (50000 UNIT) CAPS capsule 1 cap(s) 06/22/21   [provider]    Family History Family History  Problem Relation Age of Onset   Hypertension Mother    Heart disease Brother    Congestive Heart Failure Brother 67    Social History Social History   Tobacco Use   Smoking status: Never   Smokeless tobacco: Never  Vaping Use   Vaping Use: Never used  Substance Use Topics   Alcohol use: Yes    Comment: occ   Drug use: Yes    Types: Marijuana     Allergies   Patient has no known allergies.   Review of Systems Review of Systems   Physical Exam Triage Vital Signs ED Triage Vitals  Enc Vitals Group     BP 08/19/21 1054 (!) 141/98  Pulse Rate 08/19/21 1054 75     Resp 08/19/21 1054 18     Temp 08/19/21 1054 (!) 97.5 F (36.4 C)     Temp Source 08/19/21 1054 Oral     SpO2 08/19/21 1054 100 %     Weight --      Height --      Head Circumference --      Peak Flow --      Pain Score 08/19/21 1051 6     Pain Loc --      Pain Edu? --      Excl. in GC? --    No data found.  Updated Vital Signs BP (!) 141/98 (BP Location: Left Arm)    Pulse 75    Temp (!) 97.5 F (36.4 C) (Oral)    Resp 18    LMP 08/01/2021    SpO2 100%   Visual Acuity Right Eye Distance:   Left Eye Distance:   Bilateral Distance:    Right Eye Near:   Left Eye Near:    Bilateral Near:     Physical Exam Vitals reviewed.  Constitutional:      General: She is not in acute distress.    Appearance: She is not toxic-appearing.  HENT:     Nose: Nose normal.      Mouth/Throat:     Mouth: Mucous membranes are moist.     Comments: White patches on tongue and on hard palate. MM moist Eyes:     Extraocular Movements: Extraocular movements intact.     Pupils: Pupils are equal, round, and reactive to light.  Cardiovascular:     Rate and Rhythm: Normal rate and regular rhythm.     Heart sounds: No murmur heard. Pulmonary:     Breath sounds: Normal breath sounds.  Neurological:     General: No focal deficit present.     Mental Status: She is alert and oriented to person, place, and time.  Psychiatric:        Behavior: Behavior normal.     UC Treatments / Results  Labs (all labs ordered are listed, but only abnormal results are displayed) Labs Reviewed - No data to display  EKG   Radiology No results found.  Procedures Procedures (including critical care time)  Medications Ordered in UC Medications - No data to display  Initial Impression / Assessment and Plan / UC Course  I have reviewed the triage vital signs and the nursing notes.  Pertinent labs & imaging results that were available during my care of the patient were reviewed by me and considered in my medical decision making (see chart for details).     Will treat for thrush with fluconazole Final Clinical Impressions(s) / UC Diagnoses   Final diagnoses:  Oral thrush     Discharge Instructions      Fluconazole 100 mg--take 2 tabs together the first day, then 1 daily for 6 more days.     ED Prescriptions     Medication Sig Dispense Auth. Provider   fluconazole (DIFLUCAN) 100 MG tablet 2 tabs orally the first day, then 1 tab daily for 6 more days. 8 tablet Blyss Lugar, Janace Aris, MD      PDMP not reviewed this encounter.   Zenia Resides, MD 08/19/21 [phone]

## 2021-08-24 ENCOUNTER — Inpatient Hospital Stay: Payer: Medicaid Other | Admitting: Physician Assistant

## 2021-08-24 ENCOUNTER — Inpatient Hospital Stay: Payer: Medicaid Other

## 2022-05-23 ENCOUNTER — Emergency Department (HOSPITAL_COMMUNITY)
Admission: EM | Admit: 2022-05-23 | Discharge: 2022-05-23 | Discharge status: Against Medical Advice | Payer: Medicaid Other

## 2022-05-23 NOTE — ED Notes (Signed)
Called pt at [facility] and 1825, nurse first advised that pt was still in peds with her childen

## 2022-05-23 NOTE — ED Notes (Signed)
Patient called in peds waiting and adult waiting.  No response to being called.

## 2022-06-14 ENCOUNTER — Ambulatory Visit: Payer: Medicaid Other | Admitting: Certified Nurse Midwife

## 2022-06-14 DIAGNOSIS — Z1272 Encounter for screening for malignant neoplasm of vagina: Secondary | ICD-10-CM

## 2022-06-14 DIAGNOSIS — Z01419 Encounter for gynecological examination (general) (routine) without abnormal findings: Secondary | ICD-10-CM

## 2022-06-14 DIAGNOSIS — Z3009 Encounter for other general counseling and advice on contraception: Secondary | ICD-10-CM

## 2022-06-14 DIAGNOSIS — Z113 Encounter for screening for infections with a predominantly sexual mode of transmission: Secondary | ICD-10-CM

## 2022-11-02 ENCOUNTER — Encounter: Payer: Medicaid Other | Admitting: Obstetrics and Gynecology

## 2022-11-22 ENCOUNTER — Other Ambulatory Visit: Payer: Self-pay | Admitting: Internal Medicine

## 2022-11-22 DIAGNOSIS — Z1231 Encounter for screening mammogram for malignant neoplasm of breast: Secondary | ICD-10-CM

## 2022-12-20 ENCOUNTER — Ambulatory Visit: Payer: Medicaid Other

## 2022-12-26 ENCOUNTER — Encounter: Payer: Medicaid Other | Admitting: Internal Medicine

## 2022-12-26 NOTE — Progress Notes (Deleted)
   Office Visit Note  Patient: Tiffany Rose             Date of Birth: 07-26-1976           MRN: [id number]             PCP: Norm Salt, PA Referring: Norm Salt, [place] Visit Date: 12/26/2022 Occupation: @GUAROCC @  Subjective:  No chief complaint on file.   History of Present Illness: Tiffany Rose is a 47 y.o. female here for evaluation of positive ANA and multiple joint pains. Labs also showing iron deficiency and vitamin D deficiency.***   ANA pos dsDNA neg  Activities of Daily Living:  Patient reports morning stiffness for *** {minute/hour:19697}.   Patient {ACTIONS;DENIES/REPORTS:21021675::"Denies"} nocturnal pain.  Difficulty dressing/grooming: {ACTIONS;DENIES/REPORTS:21021675::"Denies"} Difficulty climbing stairs: {ACTIONS;DENIES/REPORTS:21021675::"Denies"} Difficulty getting out of chair: {ACTIONS;DENIES/REPORTS:21021675::"Denies"} Difficulty using hands for taps, buttons, cutlery, and/or writing: {ACTIONS;DENIES/REPORTS:21021675::"Denies"}  No Rheumatology ROS completed.   PMFS History:  There are no problems to display for this patient.   Past Medical History:  Diagnosis Date   Anxiety    Depression    Hypertension    Immune deficiency disorder (HCC)    Iron (Fe) deficiency anemia    Prediabetes     Family History  Problem Relation Age of Onset   Hypertension Mother    Heart disease Brother    Congestive Heart Failure Brother 46   No past surgical history on file. Social History   Social History Narrative   Not on file    There is no immunization history on file for this patient.   Objective: Vital Signs: There were no vitals taken for this visit.   Physical Exam   Musculoskeletal Exam: ***  CDAI Exam: CDAI Score: -- Patient Global: --; Provider Global: -- Swollen: --; Tender: -- Joint Exam 12/26/2022   No joint exam has been documented for this visit   There is currently no information documented on the homunculus. Go to  the Rheumatology activity and complete the homunculus joint exam.  Investigation: No additional findings.  Imaging: No results found.  Recent Labs: Lab Results  Component Value Date   WBC 4.4 08/11/2018   HGB 10.8 (L) 08/11/2018   PLT 411 (H) 08/11/2018   NA 137 08/11/2018   K 3.6 08/11/2018   CL 98 08/11/2018   CO2 24 08/11/2018   GLUCOSE 102 (H) 08/11/2018   BUN 15 08/11/2018   CREATININE 0.92 08/11/2018   CALCIUM 10.0 08/11/2018   GFRAA >60 08/11/2018    Speciality Comments: No specialty comments available.  Procedures:  No procedures performed Allergies: Patient has no known allergies.   Assessment / Plan:     Visit Diagnoses: No diagnosis found.  Orders: No orders of the defined types were placed in this encounter.  No orders of the defined types were placed in this encounter.   Face-to-face time spent with patient was *** minutes. Greater than 50% of time was spent in counseling and coordination of care.  Follow-Up Instructions: No follow-ups on file.   Fuller Plan, MD  Note - This record has been created using [employer].  Chart creation errors have been sought, but may not always  have been located. Such creation errors do not reflect on  the standard of medical care.

## 2023-06-12 ENCOUNTER — Telehealth (HOSPITAL_COMMUNITY): Payer: Self-pay

## 2023-06-12 NOTE — Telephone Encounter (Signed)
Therapist placed a call to Tiffany Rose.  She answered and this therapist confirms this was the person I was talking to by providing two identifiers.  Therapist explains she is calling because her provider referred her for substance use services.  Tiffany Rose said she did not have any such issues, but if she ever developed any, she will call this therapist.  Remigio Eisenmenger, MS, LMFT, LCAS 06/12/23

## 2024-03-31 ENCOUNTER — Other Ambulatory Visit: Payer: Self-pay | Admitting: Physician Assistant

## 2024-03-31 DIAGNOSIS — Z1231 Encounter for screening mammogram for malignant neoplasm of breast: Secondary | ICD-10-CM

## 2024-05-01 ENCOUNTER — Ambulatory Visit

## 2024-05-29 ENCOUNTER — Ambulatory Visit

## 2024-06-02 ENCOUNTER — Ambulatory Visit
Admission: RE | Admit: 2024-06-02 | Discharge: 2024-06-02 | Disposition: A | Discharge status: Home / Self Care | Source: Ambulatory Visit | Attending: Physician Assistant | Admitting: Physician Assistant

## 2024-06-02 DIAGNOSIS — Z1231 Encounter for screening mammogram for malignant neoplasm of breast: Secondary | ICD-10-CM

## 2024-06-04 ENCOUNTER — Other Ambulatory Visit: Payer: Self-pay | Admitting: Physician Assistant

## 2024-06-04 DIAGNOSIS — N644 Mastodynia: Secondary | ICD-10-CM
